# Patient Record
Sex: Male | Born: 1940
Health system: Southern US, Community
[De-identification: ages and names within clinical notes are randomized; demographics above are authoritative.]

## PROBLEM LIST (undated history)

## (undated) DIAGNOSIS — F329 Major depressive disorder, single episode, unspecified: Secondary | ICD-10-CM

## (undated) DIAGNOSIS — S2239XA Fracture of one rib, unspecified side, initial encounter for closed fracture: Secondary | ICD-10-CM

## (undated) DIAGNOSIS — I10 Essential (primary) hypertension: Secondary | ICD-10-CM

## (undated) DIAGNOSIS — F3289 Other specified depressive episodes: Secondary | ICD-10-CM

## (undated) DIAGNOSIS — Z950 Presence of cardiac pacemaker: Secondary | ICD-10-CM

## (undated) DIAGNOSIS — G4730 Sleep apnea, unspecified: Secondary | ICD-10-CM

## (undated) DIAGNOSIS — I495 Sick sinus syndrome: Secondary | ICD-10-CM

## (undated) DIAGNOSIS — M1990 Unspecified osteoarthritis, unspecified site: Secondary | ICD-10-CM

## (undated) DIAGNOSIS — E119 Type 2 diabetes mellitus without complications: Secondary | ICD-10-CM

## (undated) HISTORY — DX: Sleep apnea, unspecified: G47.30

## (undated) HISTORY — DX: Other specified depressive episodes: F32.89

## (undated) HISTORY — DX: Essential (primary) hypertension: I10

## (undated) HISTORY — PX: PROSTATE SURGERY: SHX751

## (undated) HISTORY — DX: Type 2 diabetes mellitus without complications: E11.9

## (undated) HISTORY — DX: Sick sinus syndrome: I49.5

## (undated) HISTORY — DX: Unspecified osteoarthritis, unspecified site: M19.90

## (undated) HISTORY — DX: Presence of cardiac pacemaker: Z95.0

## (undated) HISTORY — PX: INSERT / REPLACE / REMOVE PACEMAKER: SUR710

## (undated) HISTORY — DX: Major depressive disorder, single episode, unspecified: F32.9

## (undated) HISTORY — PX: HERNIA REPAIR: SHX51

## (undated) HISTORY — PX: APPENDECTOMY: SHX54

---
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
## 2003-09-02 ENCOUNTER — Inpatient Hospital Stay (HOSPITAL_COMMUNITY): Admission: EM | Admit: 2003-09-02 | Discharge: 2003-09-03 | Payer: Self-pay | Admitting: Psychiatry

## 2006-02-16 ENCOUNTER — Ambulatory Visit: Payer: Self-pay | Admitting: Gastroenterology

## 2006-05-10 ENCOUNTER — Ambulatory Visit: Admission: RE | Admit: 2006-05-10 | Discharge: 2006-08-08 | Payer: Self-pay | Admitting: Radiation Oncology

## 2006-05-17 ENCOUNTER — Ambulatory Visit: Payer: Self-pay | Admitting: Internal Medicine

## 2006-06-23 ENCOUNTER — Ambulatory Visit (HOSPITAL_BASED_OUTPATIENT_CLINIC_OR_DEPARTMENT_OTHER): Admission: RE | Admit: 2006-06-23 | Discharge: 2006-06-23 | Payer: Self-pay | Admitting: Urology

## 2007-07-21 ENCOUNTER — Ambulatory Visit (HOSPITAL_COMMUNITY): Admission: RE | Admit: 2007-07-21 | Discharge: 2007-07-21 | Payer: Self-pay | Admitting: *Deleted

## 2007-07-21 ENCOUNTER — Encounter (INDEPENDENT_AMBULATORY_CARE_PROVIDER_SITE_OTHER): Payer: Self-pay | Admitting: Urology

## 2008-02-22 ENCOUNTER — Ambulatory Visit: Payer: Self-pay | Admitting: Internal Medicine

## 2008-02-23 ENCOUNTER — Ambulatory Visit (HOSPITAL_COMMUNITY): Admission: RE | Admit: 2008-02-23 | Discharge: 2008-02-24 | Payer: Self-pay | Admitting: Internal Medicine

## 2008-02-23 ENCOUNTER — Ambulatory Visit: Payer: Self-pay | Admitting: Internal Medicine

## 2008-02-23 HISTORY — PX: OTHER SURGICAL HISTORY: SHX169

## 2008-03-11 ENCOUNTER — Ambulatory Visit: Payer: Self-pay

## 2008-05-27 ENCOUNTER — Ambulatory Visit: Payer: Self-pay | Admitting: Internal Medicine

## 2009-01-17 ENCOUNTER — Encounter (INDEPENDENT_AMBULATORY_CARE_PROVIDER_SITE_OTHER): Payer: Self-pay

## 2009-03-11 DIAGNOSIS — I495 Sick sinus syndrome: Secondary | ICD-10-CM | POA: Insufficient documentation

## 2009-03-11 DIAGNOSIS — M129 Arthropathy, unspecified: Secondary | ICD-10-CM | POA: Insufficient documentation

## 2009-03-11 DIAGNOSIS — F329 Major depressive disorder, single episode, unspecified: Secondary | ICD-10-CM | POA: Insufficient documentation

## 2009-03-11 DIAGNOSIS — F3289 Other specified depressive episodes: Secondary | ICD-10-CM | POA: Insufficient documentation

## 2009-03-11 DIAGNOSIS — Z950 Presence of cardiac pacemaker: Secondary | ICD-10-CM | POA: Insufficient documentation

## 2009-03-11 DIAGNOSIS — E119 Type 2 diabetes mellitus without complications: Secondary | ICD-10-CM | POA: Insufficient documentation

## 2009-03-11 DIAGNOSIS — G4730 Sleep apnea, unspecified: Secondary | ICD-10-CM | POA: Insufficient documentation

## 2009-03-11 DIAGNOSIS — I10 Essential (primary) hypertension: Secondary | ICD-10-CM | POA: Insufficient documentation

## 2009-08-30 ENCOUNTER — Encounter (INDEPENDENT_AMBULATORY_CARE_PROVIDER_SITE_OTHER): Payer: Self-pay | Admitting: *Deleted

## 2009-09-09 ENCOUNTER — Telehealth: Payer: Self-pay | Admitting: Internal Medicine

## 2010-07-31 ENCOUNTER — Encounter (INDEPENDENT_AMBULATORY_CARE_PROVIDER_SITE_OTHER): Payer: Self-pay | Admitting: *Deleted

## 2010-10-25 ENCOUNTER — Encounter: Payer: Self-pay | Admitting: Neurology

## 2010-11-01 LAB — CONVERTED CEMR LAB
CO2: 28 meq/L (ref 19–32)
Calcium: 9.7 mg/dL (ref 8.4–10.5)
Eosinophils Absolute: 1.1 10*3/uL — ABNORMAL HIGH (ref 0.0–0.7)
Eosinophils Relative: 15.7 % — ABNORMAL HIGH (ref 0.0–5.0)
INR: 2 — ABNORMAL HIGH (ref 0.8–1.0)
Lymphocytes Relative: 24.6 % (ref 12.0–46.0)
MCV: 86.1 fL (ref 78.0–100.0)
Monocytes Absolute: 0.5 10*3/uL (ref 0.1–1.0)
Monocytes Relative: 6.6 % (ref 3.0–12.0)
Neutrophils Relative %: 52.5 % (ref 43.0–77.0)
Platelets: 209 10*3/uL (ref 150–400)
Potassium: 4.2 meq/L (ref 3.5–5.1)
Prothrombin Time: 22.1 s — ABNORMAL HIGH (ref 10.9–13.3)
RBC: 4.87 M/uL (ref 4.22–5.81)
WBC: 7 10*3/uL (ref 4.5–10.5)
aPTT: 69.5 s — ABNORMAL HIGH (ref 21.7–29.8)

## 2010-11-03 NOTE — Miscellaneous (Signed)
Summary: dx correction   Clinical Lists Changes  Problems: Changed problem from PACEMAKER (ICD-V45..01) to PACEMAKER, PERMANENT (ICD-V45.01) changed the incorrect dx code to correct dx code Donna Keene  July 31, 2010 12:23 PM 

## 2011-02-16 NOTE — Letter (Signed)
Feb 22, 2008    Andrew Peng, MD  [address]  [place], [place] [address]   RE:  Andrew Wu, Andrew Wu  MRN:  [id number]  /  DOB:  1940-11-23   Dear Gerda Diss:   It was a pleasure seeing Andrew Wu at your request.  As you know, he  has impressive bradycardia.  He was found to have resting rates in the  40s.  Today his resting rate is in the 30s.   He has significant fatigue.  He does not have significant exercise  intolerance and this is concordant with the results from your treadmill  where he was able to accomplish a heart rate of 80% of his predicted  maximum.   He has no syncope.  He describes no chest pain and has no antecedent  history of coronary disease.  I should note that his Myoview was normal.   PAST MEDICAL HISTORY:  Notable for  1. Hypertension.  2. Diabetes.  3. Depression.  4. Sleep apnea on CPAP.  5. Arthritis.   PAST SURGICAL HISTORY:  Notable for  1. Prostate cancer for which he had seed implants last year.  2. Bilateral inguinal herniorrhaphies.  3. Appendectomy.   SOCIAL HISTORY:  He is disabled from [facility].  He has one child and  two grandchildren.  He does not drink or use recreational drugs.  He  uses cigarettes occasionally.   REVIEW OF SYSTEMS:  As noted above.   CURRENT MEDICATIONS:  1. Wellbutrin 2 tablets a day.  2. Nexium 40.  3. Metformin 500.  4. Crestor 20.  5. Lisinopril 5.  6. Felodipine 5.  7. Aspirin 81.  8. Lantus.   ALLERGIES:  He has no known drug allergies.   PHYSICAL EXAMINATION:  GENERAL APPEARANCE:  He is an older [place]  American male appearing his stated age of 25.  VITAL SIGNS:  His blood pressure is 140/70.  His weight was 153.  His  pulse was 36.  HEENT:  Exam demonstrated no icterus or xanthoma.  NECK:  His neck veins were flat.  His carotids were brisk and full  bilaterally without bruits.  BACK:  Without kyphosis or scoliosis.  LUNGS:  Clear.  HEART:  Sounds were regular with a loud S4.  ABDOMEN:  Soft with active bowel sounds without midline pulsation or  hepatomegaly.  Femoral pulses were 2+.  EXTREMITIES:  Distal pulses were intact.  No clubbing, cyanosis or  edema.  NEUROLOGIC:  Exam was grossly normal apart from a flat affect.  SKIN:  Warm and dry.   Electrocardiogram dated today demonstrated sinus rhythm at 36 with  intervals of 0.15/0.10/0.45 with a QTC of 0.35, the axis was mildly  leftward at -20.   IMPRESSION:  1. Significant sinus bradycardia with heart rates of under 40 not      infrequently.  2. Adequate heart rate excursion.  3. Fatigue and lassitude question related to the above (see below).  4. History of depression.  5. History of prostate cancer status post seed therapy.  6. Normal left ventricular function nonischemic Myoview.   Andrew Wu, Andrew Wu, has significant resting bradycardia.  I am not  altogether sanguine that a lot of his fatigue and lassitude will be  ameliorated by pacemaker therapy, but I think it is reasonable to  proceed with pacemaker given the degree of his daytime bradycardia which  might well be considerably worse at night.   I have reviewed with him and his wife the potential  benefits as well as  potential risks of the procedure including, but not limited to, death,  perforation, infection, lead dislodgement and device malfunction.  They  understand these risks and would like to proceed.   Dwayne, thanks very much for asking [place] to see him.  I look forward to  talking to you after the procedure is completed.    Sincerely,      Duke Salvia, MD, [facility]  Electronically Signed    SCK/MedQ  DD: 02/22/2008  DT: 02/22/2008  Job #: [id number]

## 2011-02-16 NOTE — Op Note (Signed)
NAME:  Andrew Wu, Andrew Wu                 ACCOUNT NO.:  [id number]   MEDICAL RECORD NO.:  [id number]          PATIENT TYPE:  AMB   LOCATION:  DAY                          FACILITY[facility]  [facility]   PHYSICIAN:  Alfonse Ras, MD   DATE OF BIRTH:  1941-05-29   DATE OF PROCEDURE:  07/21/2007  DATE OF DISCHARGE:                               OPERATIVE REPORT   PREOPERATIVE DIAGNOSIS:  Right inguinal hernia and atrophic painful  right testicle.   PROCEDURE:  Right inguinal hernia repair with mesh and right  orchiectomy.   SURGEON:  Alfonse Ras, M.D.   ASSISTANT:  Courtney Paris, M.D.   ANESTHESIA:  General laryngeal mask.   DESCRIPTION:  The patient was taken to the operating room, placed in the  supine position.  After adequate general anesthesia was induced using  laryngeal mask, an oblique incision was made over the inguinal canal.  I  dissected down to the external oblique fascia.  This was opened along  its fibers down to the external ring.  Spermatic cord was surrounded at  the external ring with Penrose drain.  It was completely mobilized.  There was no evidence of indirect hernia sac, only a direct hernia  defect.  Dr. Aldean Ast then performed a right orchiectomy without  difficulty and that will be dictated in a separate note.   I then identified the defect in the floor of Hesselbach's triangle, and  after mobilization of the inguinal ligament and the transversalis  fascia, I  closed this primarily with interrupted 0 Surgilon sutures in  a tension-free fashion.  This was taken out past the previous internal  ring.  A piece of 3 x 6 Atrium mesh was then placed over the repair and  sutured along the pubic tubercle to the inguinal ligament brought out  far lateral to the internal ring and to the transversalis fascia.  It  laid flatly and was in good position.  Adequate hemostasis was assured.  All tissues were injected with 0.5 Marcaine.  External oblique fascia  was closed  with a running 3-0 Vicryl suture.  Skin incision was closed  with staples.  Sterile dressing was applied.  The patient tolerated the  procedure well and went to PACU in good condition.      Alfonse Ras, MD  Electronically Signed     KRE/MEDQ  D:  07/21/2007  T:  07/21/2007  Job:  [id number]

## 2011-02-16 NOTE — Op Note (Signed)
NAME:  Andrew Wu, Andrew Wu NO.:  [id number]   MEDICAL RECORD NO.:  [id number]          PATIENT TYPE:  OIB   LOCATION:  2899                         FACILITY:  [facility]   PHYSICIAN:  Duke Salvia, MD, FACCDATE OF BIRTH:  Sep 15, 1941   DATE OF PROCEDURE:  02/23/2008  DATE OF DISCHARGE:                               OPERATIVE REPORT   PREOPERATIVE DIAGNOSIS:  Sinus node dysfunction - symptomatic.   POSTOPERATIVE DIAGNOSIS:  Sinus node dysfunction - symptomatic.   PROCEDURE:  Dual-chamber pacemaker implantation.   Following obtaining informed consent, the patient was brought to  electrophysiology laboratory and placed on the fluoroscopic table in  supine position.  After routine prep drape of the left upper chest,  lidocaine was infiltrated in the prepectoral subclavicular region.  Incision was made and carried down to layer of the prepectoral fascia  using electrocautery and sharp dissection.  A pocket was formed  similarly.  Hemostasis was obtained.   Thereafter, attention was turned to gain access to extrathoracic left  subclavian vein, which was accomplished without difficulty without the  aspiration or puncture of the artery.  Two separate venipunctures were  accomplished.  A guidewire was replaced and retained and sequentially 7-  French sheaths were placed, which were passed Medtronic 5076, 58-cm  active fixation ventricular lead, serial number #[id number] and a  Medtronic 5076, 52-cm active fixation atrial lead, serial #[id number].  The ventricular lead was marked with a tie.   Under fluoroscopic guidance, ventricular lead was manipulated in the  right ventricular septum where bipolar R-wave was 9 with a pace  impedance of 738, a threshold of 0.5 volts at 0.5 msec.  Current  threshold is 0.8 mA.  There is no diaphragmatic pacing at 10 volts, and  the current of injury was present.   The atrial lead was manipulated at the right atrial appendage where the  bipolar P-wave was 2.7 with a pace impedance of 678 ohms with a  threshold of 0.6 volts at 0.5 msec.  Current threshold 0.6 mA.  The  current of injury was brisk, and there is no diaphragmatic pacing at 10  volts.  These leads were secured to the prepectoral fascia and then  attached to a Medtronic Adapta ADDRL1 pulse generator, serial number  #[id number].  Ventricular pacing and atrial pacing with intrinsic  conduction were identified.  The pocket was copiously irrigated with  antibiotic-containing saline solution.  Hemostasis was assured, and the  leads and pulse generator were placed in the pocket secured to  prepectoral fascia.  The wound was closed in three layers in a normal  fashion.  The wound was washed,  dried, and a benzoin Steri-Strip dressing was applied.  Needle counts,  sponge counts, and instrument counts were correct at the end of  procedure according to the staff.  The patient tolerated the procedure  without apparent complication.      Duke Salvia, MD, [facility]  Electronically Signed     SCK/MEDQ  D:  02/23/2008  T:  02/24/2008  Job:  [id number]   cc:   Gerda Diss MD [facility]  [facility]  [facility]

## 2011-02-16 NOTE — Discharge Summary (Signed)
NAME:  Andrew Wu, Andrew Wu NO.:  [id number]   MEDICAL RECORD NO.:  [id number]          PATIENT TYPE:  OIB   LOCATION:  2008                         FACILITY:  [facility]   PHYSICIAN:  Doylene Canning. Ladona Ridgel, MD    DATE OF BIRTH:  1940-11-04   DATE OF ADMISSION:  02/23/2008  DATE OF DISCHARGE:  02/24/2008                         DISCHARGE SUMMARY - REFERRING   PRIMARY CARE PHYSICIAN:  Dr. [name].   DISCHARGE DIAGNOSES:  1. Sinus bradycardia status post Medtronic Adapta DDD pacer on Feb 23, 2008.  2. History as noted below.   SUMMARY OF HISTORY:  Andrew Wu is a 70 year old African-American male  who was referred by his primary care physician to Dr. Graciela Husbands on Feb 22, 2008.  He was seen in the office and was found to have resting heart  rates in the 30s and 40s.  He did complain of significant fatigue;  however, a treadmill with his primary care physician showed that he did  reach 80% predicted maximum, and his Myoview was unremarkable.  His  history is notable for hypertension, diabetes, depression, sleep apnea  on CPAP and arthritis, prostate cancer with seed implant, bilateral  inguinal herniorrhaphies, appendectomy.   Dr. Graciela Husbands was not sure if his symptoms would be improved by pacemaker  therapy but felt it was reasonable to proceed given the degree of his  daytime bradycardia which might considerably be worse at night, thus his  admission.   LABORATORY DATA:  Chest x-ray on the 23rd postprocedure did not show any  acute findings post pacer.  On the 21st, preadmission labs showed an H&H  of 13.8, 41.9, normal indices, platelets 209, WBCs 7.0, PTT 22.1, INR  2.0, sodium 139, potassium 4.2, BUN 21, creatinine 0.9, glucose 124.   The patient was admitted to [facility], and Dr. Graciela Husbands inserted  a Medtronic Adapta pacer via the left subclavian vein without  difficulty.  Dr. Graciela Husbands on review, on the 23rd, felt that the patient  could be discharged home.   DISPOSITION:  The patient is discharged home.  He is asked to continue  his home medications, that they have not changed.  It is noted at this  time there is not a complete list within the chart.  From what I can  tell his medications include:  1. Wellbutrin 200 mg b.i.d.  2. Nexium 40 mg daily.  3. Metformin 500 mg daily.  4. Crestor 420 mg daily.  5. Lisinopril daily.  6. Aspirin 81 mg daily.  7. Lantus 23 units daily.  8. He was given permission to take Tylenol for pain.   He is asked to bring all medications to all appointments for  clarification.   WOUND CARE AND ACTIVITIES:  Per supplemental sheet post pacer.   He was asked to maintain a low-sodium heart-healthy, ADA diet.  Specifically, he was advised no driving Z61 days and no lifting x1 week.   DISCHARGE TIME:  35 minutes.      Joellyn Rued, PA-C      Doylene Canning. Ladona Ridgel, MD  Electronically Signed  EW/MEDQ  D:  02/24/2008  T:  02/24/2008  Job:  [id number]   cc:   Dr. [name],  [place], [place]  Duke Salvia, MD, [facility]

## 2011-02-16 NOTE — Op Note (Signed)
NAME:  Andrew Wu, Andrew Wu                 ACCOUNT NO.:  [id number]   MEDICAL RECORD NO.:  [id number]          PATIENT TYPE:  AMB   LOCATION:  DAY                          FACILITY:  [facility]   PHYSICIAN:  Courtney Paris, M.D.DATE OF BIRTH:  Dec 09, 1940   DATE OF PROCEDURE:  DATE OF DISCHARGE:  07/21/2007                               OPERATIVE REPORT   PREOPERATIVE DIAGNOSES:  1. Right inhomogeneous testicular mass.  2. Right inguinal hernia.   POSTOPERATIVE DIAGNOSES:  1. Right inhomogeneous testicular mass.  2. Right inguinal hernia.   OPERATION:  Right inguinal orchiectomy.   ANESTHESIA:  General.   SURGEON:  Courtney Paris, M.D.   ASSISTANT:  Alfonse Ras, M.D.   INDICATION:  This 70 year old patient had prostate cancer radiated and  was doing well, but turned up with an atrophic small right testis that  was painful and had on ultrasound, an inhomogeneous mass and normal  serum markers; he also had a right inguinal hernia; for this reason, he  was advised to have a right inguinal orchiectomy at the same time as his  inguinal hernia repair.   The patient was placed supine on the operative table and after the  satisfactory induction of general anesthesia, he was prepped and draped  with Betadine in the usual sterile fashion.  The side was marked and  prepped appropriately.  Dr. Colin Benton made the incision down through the  inguinal area, down to the external oblique fascia, which was opened,  and the cord was identified.  The testis was then pulled up into the  operative field.  The attachments at the gubernaculum of the inferior  testicular attachments were cut with the Bovie and the testis was then  ligated at the internal inguinal ring.  It was doubly suture-ligated  with #1 silk and the testis was then removed.  The rest of the operative  procedure will be dictated separately by Dr. Colin Benton and she went ahead  and completed the inguinal hernia repair.      Courtney Paris, M.D.  Electronically Signed     HMK/MEDQ  D:  07/21/2007  T:  07/23/2007  Job:  [id number]

## 2011-02-16 NOTE — Letter (Signed)
May 27, 2008    Dr. [name]  [address]  [address]  [place], [place]  [address]   RE:  Andrew, Wu  MRN:  [id number]  /  DOB:  [birth year]   Dear Gerda Diss,   It was a good talking to you today.  Andrew Wu pacemaker is working  fine implanted for sinus node dysfunction.  He is 93% atrial paced and  has some significant improvement in exercise tolerance.   His medications include Wellbutrin, Nexium, metformin, Crestor,  lisinopril, aspirin, and Lantus.  (See below).   On examination today, he was in no acute distress.  His blood pressure  was 130/80.  His pulse was 64.  His weight was 148.  His lungs were  clear.  His heart sounds were regular.  His pacemaker pocket was well  healed.  His extremities were without edema.   Interrogation of his Medtronic Adapta pulse generator demonstrates an R-  wave of 11 with impedance of 607, and threshold of 0.5 at 0.4.  The R-  wave of 5.6, impedance of 522, and threshold of 0.5 at 0.4.  Battery  voltage is 2.8.   IMPRESSION:  1. Sinus node dysfunction.  2. Status post pacemaker implantation for the above.  3. Truncated heart rate excursion.  4. Difficulty in affording medications.   Dwayne, I reprogrammed Andrew Wu's pacemaker to decrease his response  to his ADLs to hopefully have his heart rate not quite so rapid on  initial activity.  We have increased his max sensory rate from 130-150,  given his relatively young age and physiological age.   Further, because of the cost of the medications, I wrote him a  prescription for a generic Zocor and ranitidine to try to get on the [facility] plan and to see if these are sufficient for his symptoms and his  cholesterol.  He is to follow up with you as we discussed.  If there is  anything I can do further, please do not hesitate to contact me.    Sincerely,      Duke Salvia, MD, [facility]  Electronically Signed    SCK/MedQ  DD: 05/27/2008  DT: 05/28/2008  Job #:  [id number]

## 2011-02-19 NOTE — Discharge Summary (Signed)
NAME:  Andrew Wu, Andrew Wu NO.:  [id number]   MEDICAL RECORD NO.:  [id number]                   PATIENT TYPE:  IPS   LOCATION:  0304                                 FACILITY:  [facility]   PHYSICIAN:  Geoffery Lyons, M.D.                   DATE OF BIRTH:  December 14, 1940   DATE OF ADMISSION:  09/02/2003  DATE OF DISCHARGE:  09/03/2003                                 DISCHARGE SUMMARY   CHIEF COMPLAINT AND PRESENT ILLNESS:  This was the first admission to [facility] for this 70 year old married African-American male  with history of depression for years, lost a sister over the weekend, wanted  to give up as well what's the use.  Decreased motivation, decreased  energy, cannot make decisions, decreased sleep, felt tired, does not trust  himself to make decisions, passive suicidal ideation.  No plan.   PAST PSYCHIATRIC HISTORY:  First time at [facility].  No other  treatment.  Mariane Masters.  No history of suicide attempts.   ALCOHOL/DRUG HISTORY:  Smokes.  Denies any alcohol or any other substance  abuse.   PAST MEDICAL HISTORY:  Coronary artery disease, status post stent placement,  type 2 diabetes mellitus, gastroesophageal reflux, sleep apnea.   MEDICATIONS:  Celexa 20 mg every day, Plavix 75 mg daily, Lipitor 80 mg at  night, Altace 5 mg in the morning, Avandia 8 mg daily, Glucophage 500 mg, 2  at night.   PHYSICAL EXAMINATION:  Performed and failed to show any acute findings.   MENTAL STATUS EXAM:  Alert, cooperative male with some psychomotor  retardation, anhedonic.  Very soft spoken.  Mood depressed, tired.  Affect  depressed.  Thought processes endorses positive auditory hallucinations.  Does not appear to be responding to internal stimuli.  Decreased  concentration.  Cognition otherwise was within normal limits.   ADMISSION DIAGNOSES:   AXIS I:  Major depression; rule out with psychotic features.   AXIS II:  No diagnosis.   AXIS III:  1. Type 2 diabetes mellitus.  2. Gastroesophageal reflux.  3. Coronary artery disease.   AXIS IV:  Moderate.   AXIS V:  Global Assessment of Functioning upon admission 25; highest Global  Assessment of Functioning in the last year 65.   LABORATORY DATA:  Thyroid profile within normal limits.   HOSPITAL COURSE:  He was admitted and started intensive individual and group  psychotherapy.  He was maintained on his medications.  He was given Ambien  for sleep.  He was given 70/30 Humulin insulin 50 units in the morning and  10 mg at night, Altace 5 mg in the morning, Glucophage 500 mg, 2 at night,  Glucovance 5\500 mg in the morning, Avandia 8 mg in the morning, Plavix 75  mg in the morning, Xanax 0.5 mg every six hours as needed for anxiety,  Celexa 20 mg per day, Lipitor  80 mg at night, Nexium 30 mg before dinner.  He basically stayed the one day.  The wife was present through most of his  stay.  She was concerned that leaving the place was not going to be helping  him.  He felt like he was going to get worse if he was to stay.  The patient  agreed with his wife.  They were going to see his counselor, Mariane Masters.  Would have preferred to go to [facility].  Wife wanted to  take him home.  He was denying any suicidal ideation.  She was going to keep  an eye on him, so we went ahead and discharged to outpatient follow-up.   DISCHARGE DIAGNOSES:   AXIS I:  Major depression with psychotic features.   AXIS II:  No diagnosis.   AXIS III:  1. Coronary artery disease.  2. Gastroesophageal reflux.  3. Type 2 diabetes mellitus.   AXIS IV:  Moderate.   AXIS V:  Global Assessment of Functioning upon discharge 45.   DISCHARGE MEDICATIONS:  Continue medications.   FOLLOW UP:  Mariane Masters and Dr. Raquel James.                                               Geoffery Lyons, M.D.    IL/MEDQ  D:  09/24/2003  T:  09/25/2003  Job:  [id number]

## 2011-02-19 NOTE — Op Note (Signed)
NAME:  Andrew Wu, Andrew Wu                 ACCOUNT NO.:  [id number]   MEDICAL RECORD NO.:  [id number]          PATIENT TYPE:  AMB   LOCATION:  [facility]                         FACILITY:  [facility]   PHYSICIAN:  Courtney Paris, M.D.DATE OF BIRTH:  [birth year]   DATE OF PROCEDURE:  06/23/2006  DATE OF DISCHARGE:  06/23/2006                                 OPERATIVE REPORT   PREOPERATIVE DIAGNOSIS:  T2b Gleason 3 + 3 adenocarcinoma of prostate.   POSTOPERATIVE DIAGNOSIS:  T2b Gleason 3 + 3 adenocarcinoma of prostate.   OPERATION:  Prostate brachytherapy and cystoscopy and removal of foreign  body.   ANESTHESIA:  General.   SURGEONS:  1. Courtney Paris, M.D.  2. Maryln Gottron, M.D.   BRIEF HISTORY:  This 70 year old black male is admitted with clinical stage  2b Gleason 3 + 3 adenocarcinoma of the prostate.  He comes in for seed  implant at this time.  He had a PSA of 4.2, June 2007, also 4.6, August  2006, 3.8, July 2006.  He is diabetic.  He has had heart stents and has had  depression, and has been on disability since May 2007.  He enters now for  prostate brachytherapy as primary treatment for his prostate cancer.   DESCRIPTION OF PROCEDURE:  The patient was placed on the operating table in  the dorsal lithotomy position after satisfactory induction of general  anesthesia.  The Foley catheter was inserted as well as the rectal tube and  the 7.5-MHz rectal ultrasound probe.  Prostate was positioned as it was on  the preliminary ultrasounds.  The urethra and rectum were outlined and the  treatment was then planned.  A total of 23 needles were then used to insert  64 seeds of I-125, taking care to preserve the urethra and spare the rectum.  This was done systematically with a Nucletron delivery device, effecting a  good implant as seen on a post-implant x-ray.  The ultrasound was used to  guide the needle.  The probes and ultrasound were then removed, the patient  was reprepped  and draped and a flexible cystoscope was then passed into the  bladder.  He had a mildly enlarged prostate, no anterior stricture was seen.  Posterior urethra was not obstructing.  The bladder was entered.  There were  a few small blood clots in the bladder and a small seed was noted.  I tried  to get this with the flexible scope and a grasper, but was unable to do so.  I had to then use a #21 panendoscope with a grasping forceps of grab the  seed and remove this intact.  When this was done, a #18 Foley catheter was  inserted and left to straight drainage.  The urine was clear.  The patient  then taken to the recovery room in good condition and will be later  discharged as an outpatient and we will remove his catheter in 4 days' time.  He will be covered with antibiotics and detailed postoperative instructions  will be given.      [place].  Aldean Ast, M.D.  Electronically Signed    HMK/MEDQ  D:  06/23/2006  T:  06/25/2006  Job:  [id number]

## 2011-03-25 ENCOUNTER — Encounter: Payer: Self-pay | Admitting: Cardiovascular Disease

## 2011-07-14 LAB — DIFFERENTIAL
Basophils Relative: 1
Eosinophils Absolute: 1.4 — ABNORMAL HIGH
Neutrophils Relative %: 57

## 2011-07-14 LAB — URINALYSIS, ROUTINE W REFLEX MICROSCOPIC
Ketones, ur: NEGATIVE
Leukocytes, UA: NEGATIVE
Protein, ur: NEGATIVE
Urobilinogen, UA: 0.2

## 2011-07-14 LAB — CBC
Hemoglobin: 13.5
Platelets: 208

## 2011-07-14 LAB — URINE MICROSCOPIC-ADD ON

## 2011-08-13 ENCOUNTER — Emergency Department: Payer: Self-pay | Admitting: *Deleted

## 2011-11-05 ENCOUNTER — Other Ambulatory Visit (HOSPITAL_COMMUNITY): Payer: Self-pay | Admitting: Urology

## 2011-11-11 ENCOUNTER — Ambulatory Visit (HOSPITAL_COMMUNITY)
Admission: RE | Admit: 2011-11-11 | Discharge: 2011-11-11 | Disposition: A | Discharge status: Home / Self Care | Payer: Medicare Other | Source: Ambulatory Visit | Attending: Urology | Admitting: Urology

## 2011-11-11 DIAGNOSIS — N433 Hydrocele, unspecified: Secondary | ICD-10-CM | POA: Insufficient documentation

## 2011-12-15 ENCOUNTER — Ambulatory Visit: Payer: Self-pay | Admitting: Otolaryngology

## 2011-12-29 DIAGNOSIS — R0982 Postnasal drip: Secondary | ICD-10-CM | POA: Insufficient documentation

## 2011-12-29 DIAGNOSIS — E119 Type 2 diabetes mellitus without complications: Secondary | ICD-10-CM | POA: Insufficient documentation

## 2011-12-29 DIAGNOSIS — R059 Cough, unspecified: Secondary | ICD-10-CM | POA: Insufficient documentation

## 2011-12-29 DIAGNOSIS — J342 Deviated nasal septum: Secondary | ICD-10-CM | POA: Insufficient documentation

## 2011-12-29 DIAGNOSIS — R1310 Dysphagia, unspecified: Secondary | ICD-10-CM | POA: Insufficient documentation

## 2012-02-16 ENCOUNTER — Emergency Department: Payer: Self-pay | Admitting: Emergency Medicine

## 2012-11-29 ENCOUNTER — Ambulatory Visit: Payer: Self-pay | Admitting: Gastroenterology

## 2013-11-14 DIAGNOSIS — J329 Chronic sinusitis, unspecified: Secondary | ICD-10-CM | POA: Insufficient documentation

## 2013-12-18 ENCOUNTER — Emergency Department: Payer: Self-pay | Admitting: Emergency Medicine

## 2014-10-07 ENCOUNTER — Emergency Department (HOSPITAL_COMMUNITY): Payer: Commercial Managed Care - HMO

## 2014-10-07 ENCOUNTER — Emergency Department (HOSPITAL_COMMUNITY)
Admission: EM | Admit: 2014-10-07 | Discharge: 2014-10-07 | Disposition: A | Discharge status: Home / Self Care | Payer: Commercial Managed Care - HMO | Attending: Emergency Medicine | Admitting: Emergency Medicine

## 2014-10-07 ENCOUNTER — Encounter (HOSPITAL_COMMUNITY): Payer: Self-pay | Admitting: Cardiology

## 2014-10-07 DIAGNOSIS — Y9289 Other specified places as the place of occurrence of the external cause: Secondary | ICD-10-CM | POA: Insufficient documentation

## 2014-10-07 DIAGNOSIS — J181 Lobar pneumonia, unspecified organism: Secondary | ICD-10-CM | POA: Insufficient documentation

## 2014-10-07 DIAGNOSIS — Z720 Tobacco use: Secondary | ICD-10-CM | POA: Insufficient documentation

## 2014-10-07 DIAGNOSIS — M6281 Muscle weakness (generalized): Secondary | ICD-10-CM | POA: Insufficient documentation

## 2014-10-07 DIAGNOSIS — S299XXA Unspecified injury of thorax, initial encounter: Secondary | ICD-10-CM | POA: Diagnosis not present

## 2014-10-07 DIAGNOSIS — Y998 Other external cause status: Secondary | ICD-10-CM | POA: Diagnosis not present

## 2014-10-07 DIAGNOSIS — S79911A Unspecified injury of right hip, initial encounter: Secondary | ICD-10-CM | POA: Diagnosis not present

## 2014-10-07 DIAGNOSIS — W1830XA Fall on same level, unspecified, initial encounter: Secondary | ICD-10-CM | POA: Insufficient documentation

## 2014-10-07 DIAGNOSIS — I10 Essential (primary) hypertension: Secondary | ICD-10-CM | POA: Insufficient documentation

## 2014-10-07 DIAGNOSIS — Y9389 Activity, other specified: Secondary | ICD-10-CM | POA: Insufficient documentation

## 2014-10-07 DIAGNOSIS — Z950 Presence of cardiac pacemaker: Secondary | ICD-10-CM | POA: Diagnosis not present

## 2014-10-07 DIAGNOSIS — E119 Type 2 diabetes mellitus without complications: Secondary | ICD-10-CM | POA: Diagnosis not present

## 2014-10-07 DIAGNOSIS — Z8669 Personal history of other diseases of the nervous system and sense organs: Secondary | ICD-10-CM | POA: Diagnosis not present

## 2014-10-07 DIAGNOSIS — S0990XA Unspecified injury of head, initial encounter: Secondary | ICD-10-CM | POA: Diagnosis not present

## 2014-10-07 DIAGNOSIS — R29898 Other symptoms and signs involving the musculoskeletal system: Secondary | ICD-10-CM

## 2014-10-07 DIAGNOSIS — S8991XA Unspecified injury of right lower leg, initial encounter: Secondary | ICD-10-CM | POA: Diagnosis not present

## 2014-10-07 DIAGNOSIS — J189 Pneumonia, unspecified organism: Secondary | ICD-10-CM

## 2014-10-07 DIAGNOSIS — S3992XA Unspecified injury of lower back, initial encounter: Secondary | ICD-10-CM | POA: Diagnosis not present

## 2014-10-07 DIAGNOSIS — Z8659 Personal history of other mental and behavioral disorders: Secondary | ICD-10-CM | POA: Insufficient documentation

## 2014-10-07 DIAGNOSIS — M25551 Pain in right hip: Secondary | ICD-10-CM | POA: Diagnosis not present

## 2014-10-07 DIAGNOSIS — Z8739 Personal history of other diseases of the musculoskeletal system and connective tissue: Secondary | ICD-10-CM | POA: Diagnosis not present

## 2014-10-07 DIAGNOSIS — R05 Cough: Secondary | ICD-10-CM | POA: Diagnosis not present

## 2014-10-07 LAB — CBG MONITORING, ED
GLUCOSE-CAPILLARY: 348 mg/dL — AB (ref 70–99)
GLUCOSE-CAPILLARY: 261 mg/dL — AB (ref 70–99)
Glucose-Capillary: 261 mg/dL — ABNORMAL HIGH (ref 70–99)

## 2014-10-07 LAB — COMPREHENSIVE METABOLIC PANEL
ALK PHOS: 58 U/L (ref 39–117)
ALT: 27 U/L (ref 0–53)
ANION GAP: 11 (ref 5–15)
AST: 68 U/L — ABNORMAL HIGH (ref 0–37)
Albumin: 3.2 g/dL — ABNORMAL LOW (ref 3.5–5.2)
BILIRUBIN TOTAL: 1.6 mg/dL — AB (ref 0.3–1.2)
BUN: 23 mg/dL (ref 6–23)
CHLORIDE: 98 meq/L (ref 96–112)
CO2: 20 mmol/L (ref 19–32)
CREATININE: 1.25 mg/dL (ref 0.50–1.35)
Calcium: 9.1 mg/dL (ref 8.4–10.5)
GFR calc non Af Amer: 56 mL/min — ABNORMAL LOW (ref >90)
GFR, EST AFRICAN AMERICAN: 65 mL/min — AB (ref >90)
GLUCOSE: 373 mg/dL — AB (ref 70–99)
POTASSIUM: 4.4 mmol/L (ref 3.5–5.1)
Sodium: 129 mmol/L — ABNORMAL LOW (ref 135–145)
Total Protein: 6.3 g/dL (ref 6.0–8.3)

## 2014-10-07 LAB — URINALYSIS, ROUTINE W REFLEX MICROSCOPIC
Bilirubin Urine: NEGATIVE
Glucose, UA: >1000 mg/dL — AB
KETONES UR: 40 mg/dL — AB
LEUKOCYTES UA: NEGATIVE
Nitrite: NEGATIVE
PROTEIN: NEGATIVE mg/dL
Specific Gravity, Urine: 1.034 — ABNORMAL HIGH (ref 1.005–1.030)
UROBILINOGEN UA: 0.2 mg/dL (ref 0.0–1.0)
pH: 5 (ref 5.0–8.0)

## 2014-10-07 LAB — CBC WITH DIFFERENTIAL/PLATELET
Basophils Absolute: 0 10*3/uL (ref 0.0–0.1)
Basophils Relative: 0 % (ref 0–1)
EOS PCT: 0 % (ref 0–5)
Eosinophils Absolute: 0 10*3/uL (ref 0.0–0.7)
HEMATOCRIT: 36.9 % — AB (ref 39.0–52.0)
HEMOGLOBIN: 12.3 g/dL — AB (ref 13.0–17.0)
LYMPHS ABS: 0.8 10*3/uL (ref 0.7–4.0)
LYMPHS PCT: 9 % — AB (ref 12–46)
MCH: 27 pg (ref 26.0–34.0)
MCHC: 33.3 g/dL (ref 30.0–36.0)
MCV: 81.1 fL (ref 78.0–100.0)
MONO ABS: 0.8 10*3/uL (ref 0.1–1.0)
Monocytes Relative: 10 % (ref 3–12)
Neutro Abs: 7.1 10*3/uL (ref 1.7–7.7)
Neutrophils Relative %: 81 % — ABNORMAL HIGH (ref 43–77)
Platelets: 168 10*3/uL (ref 150–400)
RBC: 4.55 MIL/uL (ref 4.22–5.81)
RDW: 14.3 % (ref 11.5–15.5)
WBC: 8.7 10*3/uL (ref 4.0–10.5)

## 2014-10-07 LAB — URINE MICROSCOPIC-ADD ON

## 2014-10-07 LAB — I-STAT TROPONIN, ED: Troponin i, poc: 0.07 ng/mL (ref 0.00–0.08)

## 2014-10-07 MED ORDER — ACETAMINOPHEN 325 MG PO TABS: 650.0000 mg | ORAL_TABLET | Freq: Once | ORAL | Status: DC

## 2014-10-07 MED ORDER — AZITHROMYCIN 250 MG PO TABS
500.0000 mg | ORAL_TABLET | Freq: Once | ORAL | Status: AC
  Administered 2014-10-07: 500 mg via ORAL
  Filled 2014-10-07: qty 2

## 2014-10-07 MED ORDER — ACETAMINOPHEN 500 MG PO TABS
1000.0000 mg | ORAL_TABLET | Freq: Once | ORAL | Status: AC
  Administered 2014-10-07: 1000 mg via ORAL
  Filled 2014-10-07: qty 2

## 2014-10-07 MED ORDER — SODIUM CHLORIDE 0.9 % IV BOLUS (SEPSIS)
500.0000 mL | Freq: Once | INTRAVENOUS | Status: AC
  Administered 2014-10-07: 500 mL via INTRAVENOUS

## 2014-10-07 MED ORDER — AZITHROMYCIN 250 MG PO TABS: 250.0000 mg | ORAL_TABLET | Freq: Every day | ORAL | Status: DC

## 2014-10-07 NOTE — ED Notes (Signed)
Patient ready for discharge.  He and wife verbalized understanding of discharge instructions.  Patient to continue regular meds.  Treat fever at home.  Return for any increased weakness or sob or other concerns.

## 2014-10-07 NOTE — Discharge Instructions (Signed)
Zithromax as prescribed.  Follow-up with your primary Dr. to discuss your medications and whether he believes treatment with prednisone is appropriate.  Return to the emergency department if your symptoms substantially worsen or change. Pneumonia Pneumonia is an infection of the lungs.  CAUSES Pneumonia may be caused by bacteria or a virus. Usually, these infections are caused by breathing infectious particles into the lungs (respiratory tract). SIGNS AND SYMPTOMS   Cough.  Fever.  Chest pain.  Increased rate of breathing.  Wheezing.  Mucus production. DIAGNOSIS  If you have the common symptoms of pneumonia, your health care provider will typically confirm the diagnosis with a chest X-ray. The X-ray will show an abnormality in the lung (pulmonary infiltrate) if you have pneumonia. Other tests of your blood, urine, or sputum may be done to find the specific cause of your pneumonia. Your health care provider may also do tests (blood gases or pulse oximetry) to see how well your lungs are working. TREATMENT  Some forms of pneumonia may be spread to other people when you cough or sneeze. You may be asked to wear a mask before and during your exam. Pneumonia that is caused by bacteria is treated with antibiotic medicine. Pneumonia that is caused by the influenza virus may be treated with an antiviral medicine. Most other viral infections must run their course. These infections will not respond to antibiotics.  HOME CARE INSTRUCTIONS   Cough suppressants may be used if you are losing too much rest. However, coughing protects you by clearing your lungs. You should avoid using cough suppressants if you can.  Your health care provider may have prescribed medicine if he or she thinks your pneumonia is caused by bacteria or influenza. Finish your medicine even if you start to feel better.  Your health care provider may also prescribe an expectorant. This loosens the mucus to be coughed  up.  Take medicines only as directed by your health care provider.  Do not smoke. Smoking is a common cause of bronchitis and can contribute to pneumonia. If you are a smoker and continue to smoke, your cough may last several weeks after your pneumonia has cleared.  A cold steam vaporizer or humidifier in your room or home may help loosen mucus.  Coughing is often worse at night. Sleeping in a semi-upright position in a recliner or using a couple pillows under your head will help with this.  Get rest as you feel it is needed. Your body will usually let you know when you need to rest. PREVENTION A pneumococcal shot (vaccine) is available to prevent a common bacterial cause of pneumonia. This is usually suggested for:  People over 90 years old.  Patients on chemotherapy.  People with chronic lung problems, such as bronchitis or emphysema.  People with immune system problems. If you are over 65 or have a high risk condition, you may receive the pneumococcal vaccine if you have not received it before. In some countries, a routine influenza vaccine is also recommended. This vaccine can help prevent some cases of pneumonia.You may be offered the influenza vaccine as part of your care. If you smoke, it is time to quit. You may receive instructions on how to stop smoking. Your health care provider can provide medicines and counseling to help you quit. SEEK MEDICAL CARE IF: You have a fever. SEEK IMMEDIATE MEDICAL CARE IF:   Your illness becomes worse. This is especially true if you are elderly or weakened from any other disease.  You cannot control your cough with suppressants and are losing sleep.  You begin coughing up blood.  You develop pain which is getting worse or is uncontrolled with medicines.  Any of the symptoms which initially brought you in for treatment are getting worse rather than better.  You develop shortness of breath or chest pain. MAKE SURE YOU:   Understand  these instructions.  Will watch your condition.  Will get help right away if you are not doing well or get worse. Document Released: 09/20/2005 Document Revised: 02/04/2014 Document Reviewed: 12/10/2010 [facility] Patient Information 2015 [place], [place]. This information is not intended to replace advice given to you by your health care provider. Make sure you discuss any questions you have with your health care provider.

## 2014-10-07 NOTE — ED Provider Notes (Addendum)
CSN: [id number]     Arrival date & time 10/07/14  3546 History   First MD Initiated Contact with Patient 10/07/14 0848     Chief Complaint  Patient presents with  . Fall  . 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 He presents today with complaints of generalized weakness that has worsened for the past 2 weeks. He states that his right knee "gave out" on him this morning while he was attempting to ambulate and caused him to fall. He denies any specific injury. He had a similar episode 2 or 3 years ago and was treated with a steroid. This seemed to help his symptoms.  The patient's family member at bedside is also endorsing multiple other complaints, including disorientation that has been occurring intermittently for the past 2 weeks, right hip pain. She also reports that he has had a tremor for the past 5 years. This has been worked up by neurology and was thought to be a benign tremor and non-Parkinson's related.  Patient is a 74 y.o. male presenting with weakness. The history is provided by the patient.  Weakness This is a new problem. Episode onset: 2 weeks ago. The problem occurs constantly. The problem has been gradually worsening. Pertinent negatives include no chest pain, no headaches and no shortness of breath. Nothing aggravates the symptoms. Nothing relieves the symptoms. He has tried nothing for the symptoms. The treatment provided no relief.    Past Medical History  Diagnosis Date  . Cardiac pacemaker in situ   . Sinoatrial node dysfunction   . Unspecified essential hypertension   . Arthritis   . Unspecified sleep apnea   . Depressive disorder, not elsewhere classified   . Type II or unspecified type diabetes mellitus without mention of complication, not stated as  uncontrolled    Past Surgical History  Procedure Laterality Date  . Pacemaker  02/23/08    medtronic Adapta-sinus node dysfunction    History reviewed. No pertinent family history. History  Substance Use Topics  . Smoking status: Current Some Day Smoker  . Smokeless tobacco: Not on file  . Alcohol Use: No    Review of Systems  Respiratory: Negative for shortness of breath.   Cardiovascular: Negative for chest pain.  Neurological: Positive for weakness. Negative for headaches.  All other systems reviewed and are negative.     Allergies  Asa  Home Medications   Prior to Admission medications   Not on File   BP 128/55 mmHg  Pulse 71  Temp(Src) 99.2 F (37.3 C) (Oral)  Resp 16  Ht 5' 7.5" (1.715 m)  Wt 160 lb (72.576 kg)  BMI 24.68 kg/m2  SpO2 98% Physical Exam  Constitutional: He is oriented to person, place, and time. He appears well-developed and well-nourished. No distress.  HENT:  Head: Normocephalic and atraumatic.  Mouth/Throat: Oropharynx is clear and moist.  Eyes: EOM are normal. Pupils are equal, round, and reactive to light.  Neck: Normal range of motion. Neck supple.  Cardiovascular: Normal rate, regular rhythm and normal heart sounds.   No murmur heard. Pulmonary/Chest: Effort normal and breath sounds normal. No respiratory distress. He has no wheezes.  Abdominal: Soft. Bowel sounds are normal. He exhibits no distension. There is no tenderness.  Musculoskeletal: Normal range of motion. He exhibits no edema.  The right hip and remainder of the right lower extremity all appear  normal. There is no deformity or swelling. Both legs are symmetrical. There is no calf tenderness or swelling. Homans sign is absent bilaterally. DP pulses are palpable bilaterally.  He has good range of motion with his right hip and knee without effusion or limitation.  Lymphadenopathy:    He has no cervical adenopathy.  Neurological: He is alert and oriented to person, place,  and time. No cranial nerve deficit. He exhibits normal muscle tone. Coordination normal.  Skin: Skin is warm and dry. He is not diaphoretic.  Nursing note and vitals reviewed.   ED Course  Procedures (including critical care time) Labs Review Labs Reviewed  COMPREHENSIVE METABOLIC PANEL  CBC WITH DIFFERENTIAL  URINALYSIS, ROUTINE W REFLEX MICROSCOPIC  I-STAT [place], ED    Imaging Review No results found.   EKG Interpretation   Date/Time:  Monday October 07 2014 08:53:02 EST Ventricular Rate:  79 PR Interval:  147 QRS Duration: 109 QT Interval:  363 QTC Calculation: 416 R Axis:   -48 Text Interpretation:  Sinus rhythm Incomplete RBBB and LAFB Confirmed by  DELOS  MD, Dublin Grayer ([id number]) on 10/07/2014 10:49:23 AM      MDM   Final diagnoses:  Right leg weakness    Patient is a 74 year old male who presents for evaluation of weakness, tremor, and recent falls. Is also complaining of hip pain. He has multiple complaints that are seemingly unrelated. His workup does not reveal any acute abnormality. His x-rays do not show any obvious bony abnormality and CT scan of the head is unremarkable. His acquaintance at bedside states that he is had a "pinched nerve" before which was treated with prednisone. She is reluctant to allow him to take this again due to his blood sugars being elevated in the past.  I have found nothing emergent that requires hospitalization or acute intervention. I discussed the results of these tests with the patient and his acquaintance. She will make arrangements for him to follow-up with a primary doctor in a few days. They will discuss the prednisone which I had recommended.    Veryl Speak, MD 10/07/14 1120   Upon being discharged, it was noted that the patient was febrile with a temperature to 102.8. As there was no source for this, a chest x-ray and blood cultures were obtained as well. This shows a possible infiltrate in the right lower lobe. This  will be treated with Zithromax and when necessary return. He is not hypoxic, not having any respiratory difficulty, and is very anxious to be discharged. His blood sugars are also improving with IV fluids and I feel as though discharge is appropriate. He understands to return if his symptoms worsen or change.  Veryl Speak, MD 10/07/14 1447

## 2014-10-07 NOTE — ED Notes (Signed)
Pt reports that he fell yesterday, reports that he has pain in the right knee that caused him to fall. Reports that he has had some intermittent weakness on that side also.

## 2014-10-07 NOTE — ED Notes (Signed)
Patient noted to have temp at time of planned dicharge.  ERMD notified

## 2014-10-08 DIAGNOSIS — E871 Hypo-osmolality and hyponatremia: Secondary | ICD-10-CM | POA: Diagnosis not present

## 2014-10-08 DIAGNOSIS — E1165 Type 2 diabetes mellitus with hyperglycemia: Secondary | ICD-10-CM | POA: Diagnosis not present

## 2014-10-13 LAB — CULTURE, BLOOD (ROUTINE X 2)
CULTURE: NO GROWTH
Culture: NO GROWTH

## 2014-10-16 DIAGNOSIS — H4011X3 Primary open-angle glaucoma, severe stage: Secondary | ICD-10-CM | POA: Diagnosis not present

## 2014-10-21 DIAGNOSIS — E1165 Type 2 diabetes mellitus with hyperglycemia: Secondary | ICD-10-CM | POA: Diagnosis not present

## 2014-10-21 DIAGNOSIS — Z0000 Encounter for general adult medical examination without abnormal findings: Secondary | ICD-10-CM | POA: Diagnosis not present

## 2014-10-21 DIAGNOSIS — E780 Pure hypercholesterolemia: Secondary | ICD-10-CM | POA: Diagnosis not present

## 2014-10-21 DIAGNOSIS — I10 Essential (primary) hypertension: Secondary | ICD-10-CM | POA: Diagnosis not present

## 2014-10-29 DIAGNOSIS — I700 Atherosclerosis of aorta: Secondary | ICD-10-CM | POA: Diagnosis not present

## 2014-10-29 DIAGNOSIS — I2510 Atherosclerotic heart disease of native coronary artery without angina pectoris: Secondary | ICD-10-CM | POA: Diagnosis not present

## 2014-10-29 DIAGNOSIS — J189 Pneumonia, unspecified organism: Secondary | ICD-10-CM | POA: Diagnosis not present

## 2014-10-29 DIAGNOSIS — Z0000 Encounter for general adult medical examination without abnormal findings: Secondary | ICD-10-CM | POA: Diagnosis not present

## 2014-10-29 DIAGNOSIS — I10 Essential (primary) hypertension: Secondary | ICD-10-CM | POA: Diagnosis not present

## 2014-12-11 DIAGNOSIS — N433 Hydrocele, unspecified: Secondary | ICD-10-CM | POA: Diagnosis not present

## 2014-12-11 DIAGNOSIS — C61 Malignant neoplasm of prostate: Secondary | ICD-10-CM | POA: Diagnosis not present

## 2015-01-16 DIAGNOSIS — H4011X3 Primary open-angle glaucoma, severe stage: Secondary | ICD-10-CM | POA: Diagnosis not present

## 2015-01-23 DIAGNOSIS — H4011X3 Primary open-angle glaucoma, severe stage: Secondary | ICD-10-CM | POA: Diagnosis not present

## 2015-02-13 DIAGNOSIS — I495 Sick sinus syndrome: Secondary | ICD-10-CM | POA: Diagnosis not present

## 2015-02-19 DIAGNOSIS — E1165 Type 2 diabetes mellitus with hyperglycemia: Secondary | ICD-10-CM | POA: Diagnosis not present

## 2015-02-19 DIAGNOSIS — I10 Essential (primary) hypertension: Secondary | ICD-10-CM | POA: Diagnosis not present

## 2015-02-25 DIAGNOSIS — H04123 Dry eye syndrome of bilateral lacrimal glands: Secondary | ICD-10-CM | POA: Diagnosis not present

## 2015-03-16 DIAGNOSIS — S0502XA Injury of conjunctiva and corneal abrasion without foreign body, left eye, initial encounter: Secondary | ICD-10-CM | POA: Diagnosis not present

## 2015-03-17 DIAGNOSIS — H15102 Unspecified episcleritis, left eye: Secondary | ICD-10-CM | POA: Diagnosis not present

## 2015-03-24 DIAGNOSIS — H15102 Unspecified episcleritis, left eye: Secondary | ICD-10-CM | POA: Diagnosis not present

## 2015-05-27 DIAGNOSIS — H4011X3 Primary open-angle glaucoma, severe stage: Secondary | ICD-10-CM | POA: Diagnosis not present

## 2015-07-01 DIAGNOSIS — H4011X3 Primary open-angle glaucoma, severe stage: Secondary | ICD-10-CM | POA: Diagnosis not present

## 2015-08-20 DIAGNOSIS — H401133 Primary open-angle glaucoma, bilateral, severe stage: Secondary | ICD-10-CM | POA: Diagnosis not present

## 2015-08-22 DIAGNOSIS — E1165 Type 2 diabetes mellitus with hyperglycemia: Secondary | ICD-10-CM | POA: Diagnosis not present

## 2015-08-22 DIAGNOSIS — I10 Essential (primary) hypertension: Secondary | ICD-10-CM | POA: Diagnosis not present

## 2015-08-22 DIAGNOSIS — Z23 Encounter for immunization: Secondary | ICD-10-CM | POA: Diagnosis not present

## 2015-08-22 DIAGNOSIS — R109 Unspecified abdominal pain: Secondary | ICD-10-CM | POA: Diagnosis not present

## 2015-08-26 DIAGNOSIS — I495 Sick sinus syndrome: Secondary | ICD-10-CM | POA: Diagnosis not present

## 2015-12-03 DIAGNOSIS — Z9181 History of falling: Secondary | ICD-10-CM | POA: Insufficient documentation

## 2015-12-30 ENCOUNTER — Ambulatory Visit (INDEPENDENT_AMBULATORY_CARE_PROVIDER_SITE_OTHER): Payer: Medicare HMO | Admitting: Podiatry

## 2015-12-30 ENCOUNTER — Encounter: Payer: Self-pay | Admitting: Podiatry

## 2015-12-30 VITALS — BP 126/74 | HR 69 | Resp 18

## 2015-12-30 DIAGNOSIS — L84 Corns and callosities: Secondary | ICD-10-CM

## 2015-12-30 DIAGNOSIS — M2010 Hallux valgus (acquired), unspecified foot: Secondary | ICD-10-CM

## 2015-12-30 DIAGNOSIS — E1149 Type 2 diabetes mellitus with other diabetic neurological complication: Secondary | ICD-10-CM | POA: Diagnosis not present

## 2015-12-30 NOTE — Progress Notes (Signed)
   Subjective:    Patient ID: Andrew Hurt., male    DOB: [birth year], 75 y.o.   MRN: [id number]  HPI  The 73-year-old male presents the office today requesting diabetic shoes. He's had them previously but they're several years old. He states he has calluses to both of his feet along the bunions. Denies any open sores. He gets some occasional numbness to his feet. No other complaints at this time.    Review of Systems  All other systems reviewed and are negative.      Objective:   Physical Exam General: AAO x3, NAD  Dermatological: Hypertrophic keratotic lesions bilateral medial first MTPJ overlying a mild bunion. Upon debridement no underlying ulceration, drainage or other signs of infection. There is no other open lesions or pre-ulcerative lesions identified bilaterally.  Vascular: Dorsalis Pedis artery and Posterior Tibial artery pedal pulses are 2/4 bilateral with immedate capillary fill time. Pedal hair growth present. No varicosities and no lower extremity edema present bilateral. There is no pain with calf compression, swelling, warmth, erythema.   Neruologic: Sensation intact with Derrel Nip monofilament, decreased vibratory sensation.   Musculoskeletal: HAV is present bilaterally. No areas of pinpoint bony tenderness. MMT 5/5, range of motion intact.   Gait: Unassisted, Nonantalgic.      Assessment & Plan:  75 year old male requesting diabetic shoes with HAV, pre-ulcerative calluses, neuropathy -Treatment options discussed including all alternatives, risks, and complications - Given symptoms that he believes he developed other issues. Paperwork completed today for precertification. -Hyperkeratotic lesions 2 without any complications or bleeding. -Follow-up after the approval of diabetic shoes or sooner if any issues are to arise.  Celesta Gentile, DPM

## 2016-02-16 ENCOUNTER — Telehealth: Payer: Self-pay | Admitting: Podiatry

## 2016-02-16 NOTE — Telephone Encounter (Signed)
Patients wife called the office asking if we ever received the order from Dr. Pennie Banter office for the diabetic shoes. Per Caryl Pina, the order was received here but Dr. Pennie Banter name was marked out by someone and then Dr. Almetta Lovely name was signed to the order and [facility] would not accept this because it had been altered. I explained this to the patients wife and advise her that we would have top resend the letter to [facility] in the name of Dr. Suzette Battiest so that she could sign the order for the patient. Wife has asked [place] to prepare the document and then call her so she can pick it up and hand deliver it to the office of Dr. Shelia Media Dr. Suzette Battiest for signature. She said that Dr. Suzette Battiest was the doctor that her husband saw that ordered the diabetic shoes not Dr. Shelia Media. However, the patient sees both doctors as his PCP. Please call her when the letter is ready for pick up.

## 2016-04-07 ENCOUNTER — Ambulatory Visit (INDEPENDENT_AMBULATORY_CARE_PROVIDER_SITE_OTHER): Payer: Medicare HMO | Admitting: *Deleted

## 2016-04-07 DIAGNOSIS — L84 Corns and callosities: Secondary | ICD-10-CM

## 2016-04-07 DIAGNOSIS — E1149 Type 2 diabetes mellitus with other diabetic neurological complication: Secondary | ICD-10-CM

## 2016-04-07 DIAGNOSIS — M2010 Hallux valgus (acquired), unspecified foot: Secondary | ICD-10-CM

## 2016-04-07 NOTE — Progress Notes (Signed)
Measured for diabetic shoes and insoles. 

## 2016-06-09 ENCOUNTER — Ambulatory Visit (INDEPENDENT_AMBULATORY_CARE_PROVIDER_SITE_OTHER): Payer: Medicare HMO | Admitting: Podiatry

## 2016-06-09 DIAGNOSIS — M2010 Hallux valgus (acquired), unspecified foot: Secondary | ICD-10-CM

## 2016-06-09 DIAGNOSIS — E1149 Type 2 diabetes mellitus with other diabetic neurological complication: Secondary | ICD-10-CM

## 2016-06-09 DIAGNOSIS — L84 Corns and callosities: Secondary | ICD-10-CM

## 2016-06-17 NOTE — Progress Notes (Signed)
Patient presents today to [facility]. Shoes and inserts were dispensed and had a good fit. Break in instructions discussed. Follow-up as scheduled. No new concerns.

## 2016-07-22 ENCOUNTER — Ambulatory Visit: Payer: Medicare HMO | Admitting: Podiatry

## 2016-10-05 DIAGNOSIS — H401133 Primary open-angle glaucoma, bilateral, severe stage: Secondary | ICD-10-CM | POA: Diagnosis not present

## 2016-10-19 DIAGNOSIS — I495 Sick sinus syndrome: Secondary | ICD-10-CM | POA: Diagnosis not present

## 2016-12-29 DIAGNOSIS — Z8546 Personal history of malignant neoplasm of prostate: Secondary | ICD-10-CM | POA: Diagnosis not present

## 2017-01-04 DIAGNOSIS — H401133 Primary open-angle glaucoma, bilateral, severe stage: Secondary | ICD-10-CM | POA: Diagnosis not present

## 2017-03-10 DIAGNOSIS — E7800 Pure hypercholesterolemia, unspecified: Secondary | ICD-10-CM | POA: Diagnosis not present

## 2017-03-10 DIAGNOSIS — E1165 Type 2 diabetes mellitus with hyperglycemia: Secondary | ICD-10-CM | POA: Diagnosis not present

## 2017-03-10 DIAGNOSIS — I10 Essential (primary) hypertension: Secondary | ICD-10-CM | POA: Diagnosis not present

## 2017-03-10 DIAGNOSIS — J069 Acute upper respiratory infection, unspecified: Secondary | ICD-10-CM | POA: Diagnosis not present

## 2017-03-23 DIAGNOSIS — J309 Allergic rhinitis, unspecified: Secondary | ICD-10-CM | POA: Diagnosis not present

## 2017-03-23 DIAGNOSIS — J310 Chronic rhinitis: Secondary | ICD-10-CM | POA: Diagnosis not present

## 2017-03-23 DIAGNOSIS — R0982 Postnasal drip: Secondary | ICD-10-CM | POA: Diagnosis not present

## 2017-03-23 DIAGNOSIS — J342 Deviated nasal septum: Secondary | ICD-10-CM | POA: Diagnosis not present

## 2017-03-23 DIAGNOSIS — R05 Cough: Secondary | ICD-10-CM | POA: Diagnosis not present

## 2017-04-04 DIAGNOSIS — H401133 Primary open-angle glaucoma, bilateral, severe stage: Secondary | ICD-10-CM | POA: Diagnosis not present

## 2017-04-11 DIAGNOSIS — H401133 Primary open-angle glaucoma, bilateral, severe stage: Secondary | ICD-10-CM | POA: Diagnosis not present

## 2017-04-21 DIAGNOSIS — I495 Sick sinus syndrome: Secondary | ICD-10-CM | POA: Diagnosis not present

## 2017-04-21 DIAGNOSIS — I208 Other forms of angina pectoris: Secondary | ICD-10-CM | POA: Diagnosis not present

## 2017-04-21 DIAGNOSIS — E782 Mixed hyperlipidemia: Secondary | ICD-10-CM | POA: Diagnosis not present

## 2017-04-21 DIAGNOSIS — I2510 Atherosclerotic heart disease of native coronary artery without angina pectoris: Secondary | ICD-10-CM | POA: Diagnosis not present

## 2017-04-21 DIAGNOSIS — E119 Type 2 diabetes mellitus without complications: Secondary | ICD-10-CM | POA: Diagnosis not present

## 2017-04-21 DIAGNOSIS — I10 Essential (primary) hypertension: Secondary | ICD-10-CM | POA: Diagnosis not present

## 2017-04-21 DIAGNOSIS — Z8659 Personal history of other mental and behavioral disorders: Secondary | ICD-10-CM | POA: Diagnosis not present

## 2017-05-10 DIAGNOSIS — R05 Cough: Secondary | ICD-10-CM | POA: Diagnosis not present

## 2017-05-10 DIAGNOSIS — N529 Male erectile dysfunction, unspecified: Secondary | ICD-10-CM | POA: Diagnosis not present

## 2017-05-10 DIAGNOSIS — R0982 Postnasal drip: Secondary | ICD-10-CM | POA: Diagnosis not present

## 2017-05-10 DIAGNOSIS — I2510 Atherosclerotic heart disease of native coronary artery without angina pectoris: Secondary | ICD-10-CM | POA: Diagnosis not present

## 2017-05-10 DIAGNOSIS — Z8546 Personal history of malignant neoplasm of prostate: Secondary | ICD-10-CM | POA: Diagnosis not present

## 2017-05-10 DIAGNOSIS — M545 Low back pain: Secondary | ICD-10-CM | POA: Diagnosis not present

## 2017-05-10 DIAGNOSIS — I10 Essential (primary) hypertension: Secondary | ICD-10-CM | POA: Diagnosis not present

## 2017-05-10 DIAGNOSIS — Z950 Presence of cardiac pacemaker: Secondary | ICD-10-CM | POA: Diagnosis not present

## 2017-05-10 DIAGNOSIS — J310 Chronic rhinitis: Secondary | ICD-10-CM | POA: Diagnosis not present

## 2017-05-10 DIAGNOSIS — J309 Allergic rhinitis, unspecified: Secondary | ICD-10-CM | POA: Diagnosis not present

## 2017-05-10 DIAGNOSIS — J324 Chronic pansinusitis: Secondary | ICD-10-CM | POA: Diagnosis not present

## 2017-05-10 DIAGNOSIS — Z7901 Long term (current) use of anticoagulants: Secondary | ICD-10-CM | POA: Diagnosis not present

## 2017-05-10 DIAGNOSIS — K219 Gastro-esophageal reflux disease without esophagitis: Secondary | ICD-10-CM | POA: Diagnosis not present

## 2017-05-10 DIAGNOSIS — E1149 Type 2 diabetes mellitus with other diabetic neurological complication: Secondary | ICD-10-CM | POA: Diagnosis not present

## 2017-05-10 DIAGNOSIS — J342 Deviated nasal septum: Secondary | ICD-10-CM | POA: Diagnosis not present

## 2017-05-10 DIAGNOSIS — E7800 Pure hypercholesterolemia, unspecified: Secondary | ICD-10-CM | POA: Diagnosis not present

## 2017-05-10 DIAGNOSIS — F329 Major depressive disorder, single episode, unspecified: Secondary | ICD-10-CM | POA: Diagnosis not present

## 2017-05-10 DIAGNOSIS — J328 Other chronic sinusitis: Secondary | ICD-10-CM | POA: Diagnosis not present

## 2017-05-10 DIAGNOSIS — G250 Essential tremor: Secondary | ICD-10-CM | POA: Diagnosis not present

## 2017-05-10 DIAGNOSIS — H409 Unspecified glaucoma: Secondary | ICD-10-CM | POA: Diagnosis not present

## 2017-05-13 DIAGNOSIS — R319 Hematuria, unspecified: Secondary | ICD-10-CM | POA: Diagnosis not present

## 2017-07-11 DIAGNOSIS — Z23 Encounter for immunization: Secondary | ICD-10-CM | POA: Diagnosis not present

## 2017-07-15 DIAGNOSIS — I10 Essential (primary) hypertension: Secondary | ICD-10-CM | POA: Diagnosis not present

## 2017-07-15 DIAGNOSIS — E1149 Type 2 diabetes mellitus with other diabetic neurological complication: Secondary | ICD-10-CM | POA: Diagnosis not present

## 2017-07-15 DIAGNOSIS — L659 Nonscarring hair loss, unspecified: Secondary | ICD-10-CM | POA: Diagnosis not present

## 2017-07-15 DIAGNOSIS — E7800 Pure hypercholesterolemia, unspecified: Secondary | ICD-10-CM | POA: Diagnosis not present

## 2017-07-15 DIAGNOSIS — E1165 Type 2 diabetes mellitus with hyperglycemia: Secondary | ICD-10-CM | POA: Diagnosis not present

## 2017-07-15 DIAGNOSIS — T783XXA Angioneurotic edema, initial encounter: Secondary | ICD-10-CM | POA: Diagnosis not present

## 2017-08-08 DIAGNOSIS — E119 Type 2 diabetes mellitus without complications: Secondary | ICD-10-CM | POA: Diagnosis not present

## 2017-08-09 DIAGNOSIS — I10 Essential (primary) hypertension: Secondary | ICD-10-CM | POA: Diagnosis not present

## 2017-08-09 DIAGNOSIS — E1149 Type 2 diabetes mellitus with other diabetic neurological complication: Secondary | ICD-10-CM | POA: Diagnosis not present

## 2017-10-11 DIAGNOSIS — Z8659 Personal history of other mental and behavioral disorders: Secondary | ICD-10-CM | POA: Diagnosis not present

## 2017-10-11 DIAGNOSIS — E119 Type 2 diabetes mellitus without complications: Secondary | ICD-10-CM | POA: Diagnosis not present

## 2017-10-11 DIAGNOSIS — I10 Essential (primary) hypertension: Secondary | ICD-10-CM | POA: Diagnosis not present

## 2017-10-11 DIAGNOSIS — E782 Mixed hyperlipidemia: Secondary | ICD-10-CM | POA: Diagnosis not present

## 2017-10-11 DIAGNOSIS — Z955 Presence of coronary angioplasty implant and graft: Secondary | ICD-10-CM | POA: Diagnosis not present

## 2017-10-11 DIAGNOSIS — I208 Other forms of angina pectoris: Secondary | ICD-10-CM | POA: Diagnosis not present

## 2017-10-11 DIAGNOSIS — R001 Bradycardia, unspecified: Secondary | ICD-10-CM | POA: Diagnosis not present

## 2017-10-11 DIAGNOSIS — I495 Sick sinus syndrome: Secondary | ICD-10-CM | POA: Diagnosis not present

## 2017-10-11 DIAGNOSIS — I2510 Atherosclerotic heart disease of native coronary artery without angina pectoris: Secondary | ICD-10-CM | POA: Diagnosis not present

## 2017-11-08 DIAGNOSIS — R0982 Postnasal drip: Secondary | ICD-10-CM | POA: Diagnosis not present

## 2017-11-08 DIAGNOSIS — J320 Chronic maxillary sinusitis: Secondary | ICD-10-CM | POA: Diagnosis not present

## 2017-11-08 DIAGNOSIS — G4733 Obstructive sleep apnea (adult) (pediatric): Secondary | ICD-10-CM | POA: Diagnosis not present

## 2017-11-08 DIAGNOSIS — J301 Allergic rhinitis due to pollen: Secondary | ICD-10-CM | POA: Diagnosis not present

## 2017-11-26 DIAGNOSIS — G4733 Obstructive sleep apnea (adult) (pediatric): Secondary | ICD-10-CM | POA: Diagnosis not present

## 2017-12-06 DIAGNOSIS — H401133 Primary open-angle glaucoma, bilateral, severe stage: Secondary | ICD-10-CM | POA: Diagnosis not present

## 2017-12-20 DIAGNOSIS — E119 Type 2 diabetes mellitus without complications: Secondary | ICD-10-CM | POA: Diagnosis not present

## 2017-12-27 DIAGNOSIS — H2513 Age-related nuclear cataract, bilateral: Secondary | ICD-10-CM | POA: Diagnosis not present

## 2017-12-27 DIAGNOSIS — H401133 Primary open-angle glaucoma, bilateral, severe stage: Secondary | ICD-10-CM | POA: Diagnosis not present

## 2018-01-05 DIAGNOSIS — E119 Type 2 diabetes mellitus without complications: Secondary | ICD-10-CM | POA: Diagnosis not present

## 2018-01-05 DIAGNOSIS — H401133 Primary open-angle glaucoma, bilateral, severe stage: Secondary | ICD-10-CM | POA: Diagnosis not present

## 2018-01-05 DIAGNOSIS — H2511 Age-related nuclear cataract, right eye: Secondary | ICD-10-CM | POA: Diagnosis not present

## 2018-01-05 DIAGNOSIS — H401113 Primary open-angle glaucoma, right eye, severe stage: Secondary | ICD-10-CM | POA: Diagnosis not present

## 2018-01-05 DIAGNOSIS — Z794 Long term (current) use of insulin: Secondary | ICD-10-CM | POA: Diagnosis not present

## 2018-01-30 DIAGNOSIS — I10 Essential (primary) hypertension: Secondary | ICD-10-CM | POA: Diagnosis not present

## 2018-01-30 DIAGNOSIS — E1165 Type 2 diabetes mellitus with hyperglycemia: Secondary | ICD-10-CM | POA: Diagnosis not present

## 2018-01-30 DIAGNOSIS — E1149 Type 2 diabetes mellitus with other diabetic neurological complication: Secondary | ICD-10-CM | POA: Diagnosis not present

## 2018-01-30 DIAGNOSIS — E7800 Pure hypercholesterolemia, unspecified: Secondary | ICD-10-CM | POA: Diagnosis not present

## 2018-04-04 DIAGNOSIS — H401133 Primary open-angle glaucoma, bilateral, severe stage: Secondary | ICD-10-CM | POA: Diagnosis not present

## 2018-04-25 DIAGNOSIS — I495 Sick sinus syndrome: Secondary | ICD-10-CM | POA: Diagnosis not present

## 2018-05-02 DIAGNOSIS — E1165 Type 2 diabetes mellitus with hyperglycemia: Secondary | ICD-10-CM | POA: Diagnosis not present

## 2018-05-02 DIAGNOSIS — E7800 Pure hypercholesterolemia, unspecified: Secondary | ICD-10-CM | POA: Diagnosis not present

## 2018-05-02 DIAGNOSIS — I10 Essential (primary) hypertension: Secondary | ICD-10-CM | POA: Diagnosis not present

## 2018-05-02 DIAGNOSIS — E1149 Type 2 diabetes mellitus with other diabetic neurological complication: Secondary | ICD-10-CM | POA: Diagnosis not present

## 2018-06-15 DIAGNOSIS — E7800 Pure hypercholesterolemia, unspecified: Secondary | ICD-10-CM | POA: Diagnosis not present

## 2018-06-15 DIAGNOSIS — Z23 Encounter for immunization: Secondary | ICD-10-CM | POA: Diagnosis not present

## 2018-06-15 DIAGNOSIS — E1149 Type 2 diabetes mellitus with other diabetic neurological complication: Secondary | ICD-10-CM | POA: Diagnosis not present

## 2018-07-11 DIAGNOSIS — H2512 Age-related nuclear cataract, left eye: Secondary | ICD-10-CM | POA: Diagnosis not present

## 2018-07-11 DIAGNOSIS — H401133 Primary open-angle glaucoma, bilateral, severe stage: Secondary | ICD-10-CM | POA: Diagnosis not present

## 2018-08-06 ENCOUNTER — Emergency Department: Payer: No Typology Code available for payment source

## 2018-08-06 ENCOUNTER — Other Ambulatory Visit: Payer: Self-pay

## 2018-08-06 ENCOUNTER — Observation Stay
Admission: EM | Admit: 2018-08-06 | Discharge: 2018-08-07 | Disposition: A | Discharge status: Home / Self Care | Payer: No Typology Code available for payment source | Attending: Internal Medicine | Admitting: Internal Medicine

## 2018-08-06 ENCOUNTER — Encounter: Payer: Self-pay | Admitting: Emergency Medicine

## 2018-08-06 DIAGNOSIS — M546 Pain in thoracic spine: Secondary | ICD-10-CM | POA: Diagnosis not present

## 2018-08-06 DIAGNOSIS — E119 Type 2 diabetes mellitus without complications: Secondary | ICD-10-CM | POA: Insufficient documentation

## 2018-08-06 DIAGNOSIS — F17200 Nicotine dependence, unspecified, uncomplicated: Secondary | ICD-10-CM | POA: Insufficient documentation

## 2018-08-06 DIAGNOSIS — Y92411 Interstate highway as the place of occurrence of the external cause: Secondary | ICD-10-CM | POA: Diagnosis not present

## 2018-08-06 DIAGNOSIS — Z7982 Long term (current) use of aspirin: Secondary | ICD-10-CM | POA: Insufficient documentation

## 2018-08-06 DIAGNOSIS — M545 Low back pain: Secondary | ICD-10-CM | POA: Insufficient documentation

## 2018-08-06 DIAGNOSIS — F329 Major depressive disorder, single episode, unspecified: Secondary | ICD-10-CM | POA: Diagnosis not present

## 2018-08-06 DIAGNOSIS — R778 Other specified abnormalities of plasma proteins: Secondary | ICD-10-CM

## 2018-08-06 DIAGNOSIS — Z950 Presence of cardiac pacemaker: Secondary | ICD-10-CM | POA: Insufficient documentation

## 2018-08-06 DIAGNOSIS — E782 Mixed hyperlipidemia: Secondary | ICD-10-CM | POA: Diagnosis not present

## 2018-08-06 DIAGNOSIS — Z79899 Other long term (current) drug therapy: Secondary | ICD-10-CM | POA: Diagnosis not present

## 2018-08-06 DIAGNOSIS — S199XXA Unspecified injury of neck, initial encounter: Secondary | ICD-10-CM | POA: Diagnosis not present

## 2018-08-06 DIAGNOSIS — Z794 Long term (current) use of insulin: Secondary | ICD-10-CM | POA: Insufficient documentation

## 2018-08-06 DIAGNOSIS — T1490XA Injury, unspecified, initial encounter: Secondary | ICD-10-CM

## 2018-08-06 DIAGNOSIS — S299XXA Unspecified injury of thorax, initial encounter: Secondary | ICD-10-CM | POA: Diagnosis not present

## 2018-08-06 DIAGNOSIS — I10 Essential (primary) hypertension: Secondary | ICD-10-CM | POA: Diagnosis not present

## 2018-08-06 DIAGNOSIS — R079 Chest pain, unspecified: Secondary | ICD-10-CM

## 2018-08-06 DIAGNOSIS — S0990XA Unspecified injury of head, initial encounter: Secondary | ICD-10-CM | POA: Diagnosis not present

## 2018-08-06 DIAGNOSIS — G4730 Sleep apnea, unspecified: Secondary | ICD-10-CM | POA: Diagnosis not present

## 2018-08-06 DIAGNOSIS — S298XXA Other specified injuries of thorax, initial encounter: Secondary | ICD-10-CM

## 2018-08-06 DIAGNOSIS — M542 Cervicalgia: Secondary | ICD-10-CM | POA: Diagnosis not present

## 2018-08-06 DIAGNOSIS — R7989 Other specified abnormal findings of blood chemistry: Secondary | ICD-10-CM | POA: Diagnosis not present

## 2018-08-06 DIAGNOSIS — R9431 Abnormal electrocardiogram [ECG] [EKG]: Secondary | ICD-10-CM | POA: Diagnosis not present

## 2018-08-06 DIAGNOSIS — R748 Abnormal levels of other serum enzymes: Secondary | ICD-10-CM | POA: Diagnosis not present

## 2018-08-06 DIAGNOSIS — I495 Sick sinus syndrome: Secondary | ICD-10-CM | POA: Diagnosis not present

## 2018-08-06 DIAGNOSIS — S3991XA Unspecified injury of abdomen, initial encounter: Secondary | ICD-10-CM | POA: Diagnosis not present

## 2018-08-06 DIAGNOSIS — R1010 Upper abdominal pain, unspecified: Secondary | ICD-10-CM | POA: Diagnosis not present

## 2018-08-06 LAB — COMPREHENSIVE METABOLIC PANEL
ALT: 34 U/L (ref 0–44)
ANION GAP: 5 (ref 5–15)
AST: 39 U/L (ref 15–41)
Albumin: 3.7 g/dL (ref 3.5–5.0)
Alkaline Phosphatase: 69 U/L (ref 38–126)
BUN: 26 mg/dL — ABNORMAL HIGH (ref 8–23)
CHLORIDE: 107 mmol/L (ref 98–111)
CO2: 25 mmol/L (ref 22–32)
CREATININE: 0.79 mg/dL (ref 0.61–1.24)
Calcium: 9.7 mg/dL (ref 8.9–10.3)
Glucose, Bld: 153 mg/dL — ABNORMAL HIGH (ref 70–99)
Potassium: 4.5 mmol/L (ref 3.5–5.1)
Sodium: 137 mmol/L (ref 135–145)
Total Bilirubin: 0.8 mg/dL (ref 0.3–1.2)
Total Protein: 6.4 g/dL — ABNORMAL LOW (ref 6.5–8.1)

## 2018-08-06 LAB — CBC WITH DIFFERENTIAL/PLATELET
ABS IMMATURE GRANULOCYTES: 0.03 10*3/uL (ref 0.00–0.07)
BASOS PCT: 0 %
Basophils Absolute: 0 10*3/uL (ref 0.0–0.1)
Eosinophils Absolute: 1.1 10*3/uL — ABNORMAL HIGH (ref 0.0–0.5)
Eosinophils Relative: 16 %
HCT: 42.4 % (ref 39.0–52.0)
Hemoglobin: 13.7 g/dL (ref 13.0–17.0)
Immature Granulocytes: 0 %
Lymphocytes Relative: 30 %
Lymphs Abs: 2 10*3/uL (ref 0.7–4.0)
MCH: 27.4 pg (ref 26.0–34.0)
MCHC: 32.3 g/dL (ref 30.0–36.0)
MCV: 84.8 fL (ref 80.0–100.0)
MONO ABS: 0.5 10*3/uL (ref 0.1–1.0)
Monocytes Relative: 7 %
NEUTROS ABS: 3.1 10*3/uL (ref 1.7–7.7)
NRBC: 0 % (ref 0.0–0.2)
Neutrophils Relative %: 47 %
PLATELETS: 168 10*3/uL (ref 150–400)
RBC: 5 MIL/uL (ref 4.22–5.81)
RDW: 14.7 % (ref 11.5–15.5)
WBC: 6.8 10*3/uL (ref 4.0–10.5)

## 2018-08-06 LAB — URINALYSIS, COMPLETE (UACMP) WITH MICROSCOPIC
Bacteria, UA: NONE SEEN
Bilirubin Urine: NEGATIVE
GLUCOSE, UA: 150 mg/dL — AB
Ketones, ur: NEGATIVE mg/dL
Leukocytes, UA: NEGATIVE
Nitrite: NEGATIVE
Protein, ur: NEGATIVE mg/dL
Specific Gravity, Urine: 1.019 (ref 1.005–1.030)
pH: 6 (ref 5.0–8.0)

## 2018-08-06 LAB — GLUCOSE, CAPILLARY: GLUCOSE-CAPILLARY: 217 mg/dL — AB (ref 70–99)

## 2018-08-06 LAB — TROPONIN I
Troponin I: 0.03 ng/mL (ref <0.03)
Troponin I: 0.03 ng/mL (ref <0.03)

## 2018-08-06 LAB — HEMOGLOBIN A1C
Hgb A1c MFr Bld: 7.7 % — ABNORMAL HIGH (ref 4.8–5.6)
Mean Plasma Glucose: 174.29 mg/dL

## 2018-08-06 MED ORDER — SODIUM CHLORIDE 0.9% FLUSH
3.0000 mL | Freq: Two times a day (BID) | INTRAVENOUS | Status: DC
  Administered 2018-08-06 – 2018-08-07 (×2): 3 mL via INTRAVENOUS

## 2018-08-06 MED ORDER — INSULIN ASPART 100 UNIT/ML ~~LOC~~ SOLN
3.0000 [IU] | Freq: Three times a day (TID) | SUBCUTANEOUS | Status: DC
  Administered 2018-08-07 (×2): 3 [IU] via SUBCUTANEOUS
  Filled 2018-08-06 (×2): qty 1

## 2018-08-06 MED ORDER — ONDANSETRON HCL 4 MG PO TABS: 4.0000 mg | ORAL_TABLET | Freq: Four times a day (QID) | ORAL | Status: DC | PRN

## 2018-08-06 MED ORDER — ONDANSETRON HCL 4 MG/2ML IJ SOLN: 4.0000 mg | Freq: Four times a day (QID) | INTRAMUSCULAR | Status: DC | PRN

## 2018-08-06 MED ORDER — INSULIN ASPART 100 UNIT/ML ~~LOC~~ SOLN
0.0000 [IU] | Freq: Three times a day (TID) | SUBCUTANEOUS | Status: DC
  Administered 2018-08-07: 2 [IU] via SUBCUTANEOUS
  Administered 2018-08-07: 3 [IU] via SUBCUTANEOUS
  Filled 2018-08-06 (×2): qty 1

## 2018-08-06 MED ORDER — IBUPROFEN 400 MG PO TABS: 400.0000 mg | ORAL_TABLET | Freq: Four times a day (QID) | ORAL | Status: DC | PRN

## 2018-08-06 MED ORDER — ATORVASTATIN CALCIUM 20 MG PO TABS
40.0000 mg | ORAL_TABLET | Freq: Every day | ORAL | Status: DC
  Administered 2018-08-06 – 2018-08-07 (×2): 40 mg via ORAL
  Filled 2018-08-06 (×2): qty 2

## 2018-08-06 MED ORDER — ACETAMINOPHEN 325 MG PO TABS: 650.0000 mg | ORAL_TABLET | Freq: Four times a day (QID) | ORAL | Status: DC | PRN

## 2018-08-06 MED ORDER — INSULIN DEGLUDEC 100 UNIT/ML ~~LOC~~ SOPN: 18.0000 [IU] | PEN_INJECTOR | Freq: Every day | SUBCUTANEOUS | Status: DC

## 2018-08-06 MED ORDER — MORPHINE SULFATE (PF) 4 MG/ML IV SOLN
4.0000 mg | Freq: Once | INTRAVENOUS | Status: AC
  Administered 2018-08-06: 4 mg via INTRAVENOUS
  Filled 2018-08-06: qty 1

## 2018-08-06 MED ORDER — SODIUM CHLORIDE 0.9 % IV BOLUS
1000.0000 mL | Freq: Once | INTRAVENOUS | Status: AC
  Administered 2018-08-06: 1000 mL via INTRAVENOUS

## 2018-08-06 MED ORDER — IOPAMIDOL (ISOVUE-300) INJECTION 61%
100.0000 mL | Freq: Once | INTRAVENOUS | Status: AC | PRN
  Administered 2018-08-06: 100 mL via INTRAVENOUS
  Filled 2018-08-06: qty 100

## 2018-08-06 MED ORDER — HYDROCODONE-ACETAMINOPHEN 7.5-325 MG PO TABS
1.0000 | ORAL_TABLET | Freq: Four times a day (QID) | ORAL | Status: DC | PRN
  Administered 2018-08-06: 1 via ORAL
  Filled 2018-08-06: qty 1

## 2018-08-06 MED ORDER — ASPIRIN EC 81 MG PO TBEC
81.0000 mg | DELAYED_RELEASE_TABLET | Freq: Every day | ORAL | Status: DC
  Administered 2018-08-06 – 2018-08-07 (×2): 81 mg via ORAL
  Filled 2018-08-06 (×3): qty 1

## 2018-08-06 MED ORDER — ALBUTEROL SULFATE (2.5 MG/3ML) 0.083% IN NEBU: 2.5000 mg | INHALATION_SOLUTION | RESPIRATORY_TRACT | Status: DC | PRN

## 2018-08-06 MED ORDER — ONDANSETRON HCL 4 MG/2ML IJ SOLN
4.0000 mg | Freq: Once | INTRAMUSCULAR | Status: AC
Start: 2018-08-06 — End: 2018-08-06
  Administered 2018-08-06: 4 mg via INTRAVENOUS
  Filled 2018-08-06: qty 2

## 2018-08-06 MED ORDER — POLYETHYLENE GLYCOL 3350 17 G PO PACK: 17.0000 g | PACK | Freq: Every day | ORAL | Status: DC | PRN

## 2018-08-06 MED ORDER — INSULIN GLARGINE 100 UNIT/ML ~~LOC~~ SOLN
18.0000 [IU] | Freq: Every day | SUBCUTANEOUS | Status: DC
  Administered 2018-08-06: 18 [IU] via SUBCUTANEOUS
  Filled 2018-08-06 (×2): qty 0.18

## 2018-08-06 MED ORDER — INSULIN ASPART 100 UNIT/ML ~~LOC~~ SOLN
0.0000 [IU] | Freq: Every day | SUBCUTANEOUS | Status: DC
  Administered 2018-08-06: 2 [IU] via SUBCUTANEOUS
  Filled 2018-08-06: qty 1

## 2018-08-06 MED ORDER — ACETAMINOPHEN 650 MG RE SUPP: 650.0000 mg | Freq: Four times a day (QID) | RECTAL | Status: DC | PRN

## 2018-08-06 MED ORDER — LOSARTAN POTASSIUM 25 MG PO TABS
12.5000 mg | ORAL_TABLET | Freq: Every day | ORAL | Status: DC
  Administered 2018-08-06 – 2018-08-07 (×2): 12.5 mg via ORAL
  Filled 2018-08-06 (×2): qty 1

## 2018-08-06 MED ORDER — ENOXAPARIN SODIUM 40 MG/0.4ML ~~LOC~~ SOLN
40.0000 mg | SUBCUTANEOUS | Status: DC
  Administered 2018-08-06: 40 mg via SUBCUTANEOUS
  Filled 2018-08-06: qty 0.4

## 2018-08-06 NOTE — Plan of Care (Signed)
Pt will be informed of any test performed and updated every shift.
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
## 2018-08-06 NOTE — ED Provider Notes (Addendum)
-----------------------------------------   12:46 PM on 08/06/2018 -----------------------------------------   EKG reviewed and interpreted by myself shows a paced rhythm at 62 bpm with a prolonged PR interval, widened QRS, largely normal axis with otherwise normal intervals nonspecific ST changes.  Compared to prior EKG 10/07/2014, morphology is largely unchanged.   Harvest Dark, MD 08/06/18 1246  Repeat EKG 2: 26: 48 shows a paced rhythm at 60 bpm with a widened QRS, normal axis, largely normal intervals.  However patient does appear to now have inferolateral ST depression with T wave inversion, which is changed from his initial EKG.  We will discuss with cardiology for further recommendations.  Troponin is 0.03.  Patient does have mild chest discomfort although this is likely trauma induced.  CT scan of the chest has been performed.  Given the new T wave inversions with slight troponin elevation compared to initial troponin, the concern would be for possible cardiac contusion.  I have personally seen and evaluated the patient he continues to have pain across the lower chest.  Given his EKG changes will admit to the hospitalist service for continued monitoring.  Patient agreeable to plan of care.    Harvest Dark, MD 08/06/18 [phone]

## 2018-08-06 NOTE — ED Notes (Signed)
cbg  154 per EMS
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
## 2018-08-07 ENCOUNTER — Other Ambulatory Visit: Payer: Self-pay

## 2018-08-07 ENCOUNTER — Observation Stay
Admit: 2018-08-07 | Discharge: 2018-08-07 | Disposition: A | Discharge status: Home / Self Care | Payer: No Typology Code available for payment source | Attending: Internal Medicine | Admitting: Internal Medicine

## 2018-08-07 DIAGNOSIS — R079 Chest pain, unspecified: Secondary | ICD-10-CM | POA: Diagnosis not present

## 2018-08-07 DIAGNOSIS — S299XXA Unspecified injury of thorax, initial encounter: Secondary | ICD-10-CM | POA: Diagnosis not present

## 2018-08-07 DIAGNOSIS — E119 Type 2 diabetes mellitus without complications: Secondary | ICD-10-CM | POA: Diagnosis not present

## 2018-08-07 DIAGNOSIS — I442 Atrioventricular block, complete: Secondary | ICD-10-CM | POA: Diagnosis not present

## 2018-08-07 DIAGNOSIS — I200 Unstable angina: Secondary | ICD-10-CM | POA: Diagnosis not present

## 2018-08-07 LAB — BASIC METABOLIC PANEL
ANION GAP: 7 (ref 5–15)
BUN: 21 mg/dL (ref 8–23)
CALCIUM: 9.2 mg/dL (ref 8.9–10.3)
CHLORIDE: 107 mmol/L (ref 98–111)
CO2: 23 mmol/L (ref 22–32)
CREATININE: 0.78 mg/dL (ref 0.61–1.24)
GFR calc non Af Amer: >60 mL/min (ref >60)
Glucose, Bld: 194 mg/dL — ABNORMAL HIGH (ref 70–99)
POTASSIUM: 4.1 mmol/L (ref 3.5–5.1)
Sodium: 137 mmol/L (ref 135–145)

## 2018-08-07 LAB — GLUCOSE, CAPILLARY
GLUCOSE-CAPILLARY: 176 mg/dL — AB (ref 70–99)
Glucose-Capillary: 224 mg/dL — ABNORMAL HIGH (ref 70–99)

## 2018-08-07 LAB — ECHOCARDIOGRAM COMPLETE
Height: 67.5 in
Weight: 2500.8 oz

## 2018-08-07 LAB — TROPONIN I: Troponin I: <0.03 ng/mL (ref <0.03)

## 2018-08-07 NOTE — Progress Notes (Signed)
Inpatient Diabetes Program Recommendations  AACE/ADA: New Consensus Statement on Inpatient Glycemic Control (2015)  Target Ranges:  Prepandial:   less than 140 mg/dL      Peak postprandial:   less than 180 mg/dL (1-2 hours)      Critically ill patients:  140 - 180 mg/dL   Lab Results  Component Value Date   GLUCAP 224 (H) 08/07/2018   HGBA1C 7.7 (H) 08/06/2018   Diabetes history: DM 2 Outpatient Diabetes medications:  Spoke with patient by phone and verified that he takes [place] 18 units daily and Humalog 6 units tid with meals Inpatient Diabetes Program Recommendations:   Note D/c summary.  Please d/c both Lantus and Novolog off d/c medications on D/c summary as patient is only taking [place] and Humalog at home.   Thanks,  Adah Perl, RN, BC-ADM Inpatient Diabetes Coordinator Pager [phone] (8a-5p)

## 2018-08-07 NOTE — Care Management Note (Signed)
Case Management Note  Patient Details  Name: Andrew Wu. MRN: [id number] Date of Birth: 17-Aug-1941  Subjective/Objective:    Independent in all adls, denies issues accessing medical care, obtaining medications or with transportation.  Current with PCP.  No discharge needs identified at present by care manager or members of care team.  Wife plans to transport patient to home.                    Action/Plan:   Expected Discharge Date:  08/07/18               Expected Discharge Plan:  Home/Self Care  In-House Referral:     Discharge planning Services  CM Consult  Post Acute Care Choice:    Choice offered to:     DME Arranged:    DME Agency:     HH Arranged:    HH Agency:     Status of Service:  Completed, signed off  If discussed at [employer] of Stay Meetings, dates discussed:    Additional Comments:  Elza Rafter, RN 08/07/2018, 9:37 AM

## 2018-08-07 NOTE — Care Management (Signed)
PT has recommended out patient PT.   Order signed and sent to out patient rehab.  They will call patient with appointment.

## 2018-08-07 NOTE — Progress Notes (Signed)
*  PRELIMINARY RESULTS* Echocardiogram 2D Echocardiogram has been performed.  Sherrie Sport 08/07/2018, 10:53 AM
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
## 2018-08-07 NOTE — Plan of Care (Signed)
  Problem: Clinical Measurements: Goal: Diagnostic test results will improve Outcome: Progressing Note:  Troponins negative, CT negative   Problem: Activity: Goal: Ability to tolerate increased activity will improve Outcome: Progressing   Problem: Cardiac: Goal: Ability to achieve and maintain adequate cardiovascular perfusion will improve Outcome: Progressing Note:  No chest pain or arrhythmias over night
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
## 2018-08-07 NOTE — Progress Notes (Signed)
Pt discharged to home via wc.  Instructions  given to pt.  Questions answered.  No distress.  

## 2018-08-11 DIAGNOSIS — M542 Cervicalgia: Secondary | ICD-10-CM | POA: Diagnosis not present

## 2018-08-14 DIAGNOSIS — I10 Essential (primary) hypertension: Secondary | ICD-10-CM | POA: Diagnosis not present

## 2018-08-14 DIAGNOSIS — I2510 Atherosclerotic heart disease of native coronary artery without angina pectoris: Secondary | ICD-10-CM | POA: Diagnosis not present

## 2018-08-14 DIAGNOSIS — Z955 Presence of coronary angioplasty implant and graft: Secondary | ICD-10-CM | POA: Diagnosis not present

## 2018-08-14 DIAGNOSIS — Z8659 Personal history of other mental and behavioral disorders: Secondary | ICD-10-CM | POA: Diagnosis not present

## 2018-08-14 DIAGNOSIS — R001 Bradycardia, unspecified: Secondary | ICD-10-CM | POA: Diagnosis not present

## 2018-08-14 DIAGNOSIS — S20212D Contusion of left front wall of thorax, subsequent encounter: Secondary | ICD-10-CM | POA: Diagnosis not present

## 2018-08-14 DIAGNOSIS — I495 Sick sinus syndrome: Secondary | ICD-10-CM | POA: Diagnosis not present

## 2018-08-14 DIAGNOSIS — E782 Mixed hyperlipidemia: Secondary | ICD-10-CM | POA: Diagnosis not present

## 2018-08-16 ENCOUNTER — Ambulatory Visit: Payer: Self-pay | Admitting: Physical Therapy

## 2018-08-16 DIAGNOSIS — M542 Cervicalgia: Secondary | ICD-10-CM | POA: Diagnosis not present

## 2018-08-18 DIAGNOSIS — M542 Cervicalgia: Secondary | ICD-10-CM | POA: Diagnosis not present

## 2018-08-21 ENCOUNTER — Other Ambulatory Visit: Payer: Self-pay

## 2018-08-22 DIAGNOSIS — M542 Cervicalgia: Secondary | ICD-10-CM | POA: Diagnosis not present

## 2018-08-23 DIAGNOSIS — E1165 Type 2 diabetes mellitus with hyperglycemia: Secondary | ICD-10-CM | POA: Diagnosis not present

## 2018-08-23 DIAGNOSIS — I10 Essential (primary) hypertension: Secondary | ICD-10-CM | POA: Diagnosis not present

## 2018-08-23 DIAGNOSIS — Z125 Encounter for screening for malignant neoplasm of prostate: Secondary | ICD-10-CM | POA: Diagnosis not present

## 2018-08-23 DIAGNOSIS — E7800 Pure hypercholesterolemia, unspecified: Secondary | ICD-10-CM | POA: Diagnosis not present

## 2018-08-28 DIAGNOSIS — R300 Dysuria: Secondary | ICD-10-CM | POA: Diagnosis not present

## 2018-08-28 DIAGNOSIS — E1165 Type 2 diabetes mellitus with hyperglycemia: Secondary | ICD-10-CM | POA: Diagnosis not present

## 2018-08-28 DIAGNOSIS — Z0000 Encounter for general adult medical examination without abnormal findings: Secondary | ICD-10-CM | POA: Diagnosis not present

## 2018-08-28 DIAGNOSIS — E7800 Pure hypercholesterolemia, unspecified: Secondary | ICD-10-CM | POA: Diagnosis not present

## 2018-08-28 DIAGNOSIS — I10 Essential (primary) hypertension: Secondary | ICD-10-CM | POA: Diagnosis not present

## 2018-08-29 DIAGNOSIS — M542 Cervicalgia: Secondary | ICD-10-CM | POA: Diagnosis not present

## 2018-09-04 DIAGNOSIS — R001 Bradycardia, unspecified: Secondary | ICD-10-CM | POA: Diagnosis not present

## 2018-09-04 DIAGNOSIS — I495 Sick sinus syndrome: Secondary | ICD-10-CM | POA: Diagnosis not present

## 2018-09-04 DIAGNOSIS — Z955 Presence of coronary angioplasty implant and graft: Secondary | ICD-10-CM | POA: Diagnosis not present

## 2018-09-04 DIAGNOSIS — I208 Other forms of angina pectoris: Secondary | ICD-10-CM | POA: Diagnosis not present

## 2018-09-04 DIAGNOSIS — E782 Mixed hyperlipidemia: Secondary | ICD-10-CM | POA: Diagnosis not present

## 2018-09-04 DIAGNOSIS — I2510 Atherosclerotic heart disease of native coronary artery without angina pectoris: Secondary | ICD-10-CM | POA: Diagnosis not present

## 2018-09-04 DIAGNOSIS — I10 Essential (primary) hypertension: Secondary | ICD-10-CM | POA: Diagnosis not present

## 2018-09-04 DIAGNOSIS — Z8659 Personal history of other mental and behavioral disorders: Secondary | ICD-10-CM | POA: Diagnosis not present

## 2018-09-04 DIAGNOSIS — E119 Type 2 diabetes mellitus without complications: Secondary | ICD-10-CM | POA: Diagnosis not present

## 2018-10-19 DIAGNOSIS — I10 Essential (primary) hypertension: Secondary | ICD-10-CM | POA: Diagnosis not present

## 2018-10-19 DIAGNOSIS — E1165 Type 2 diabetes mellitus with hyperglycemia: Secondary | ICD-10-CM | POA: Diagnosis not present

## 2018-10-24 DIAGNOSIS — H401133 Primary open-angle glaucoma, bilateral, severe stage: Secondary | ICD-10-CM | POA: Diagnosis not present

## 2019-01-23 DIAGNOSIS — H401133 Primary open-angle glaucoma, bilateral, severe stage: Secondary | ICD-10-CM | POA: Diagnosis not present

## 2019-02-21 DIAGNOSIS — E1165 Type 2 diabetes mellitus with hyperglycemia: Secondary | ICD-10-CM | POA: Diagnosis not present

## 2019-02-27 DIAGNOSIS — E7800 Pure hypercholesterolemia, unspecified: Secondary | ICD-10-CM | POA: Diagnosis not present

## 2019-03-02 DIAGNOSIS — I2510 Atherosclerotic heart disease of native coronary artery without angina pectoris: Secondary | ICD-10-CM | POA: Diagnosis not present

## 2019-03-02 DIAGNOSIS — F329 Major depressive disorder, single episode, unspecified: Secondary | ICD-10-CM | POA: Diagnosis not present

## 2019-03-02 DIAGNOSIS — E7800 Pure hypercholesterolemia, unspecified: Secondary | ICD-10-CM | POA: Diagnosis not present

## 2019-03-26 DIAGNOSIS — H2512 Age-related nuclear cataract, left eye: Secondary | ICD-10-CM | POA: Diagnosis not present

## 2019-03-26 DIAGNOSIS — H401133 Primary open-angle glaucoma, bilateral, severe stage: Secondary | ICD-10-CM | POA: Diagnosis not present

## 2019-03-26 DIAGNOSIS — E119 Type 2 diabetes mellitus without complications: Secondary | ICD-10-CM | POA: Diagnosis not present

## 2019-03-27 DIAGNOSIS — I495 Sick sinus syndrome: Secondary | ICD-10-CM | POA: Diagnosis not present

## 2019-04-24 ENCOUNTER — Emergency Department
Admission: EM | Admit: 2019-04-24 | Discharge: 2019-04-24 | Discharge status: Against Medical Advice | Payer: Medicare Other | Attending: Emergency Medicine | Admitting: Emergency Medicine

## 2019-04-24 ENCOUNTER — Other Ambulatory Visit: Payer: Self-pay

## 2019-04-24 ENCOUNTER — Encounter: Payer: Self-pay | Admitting: Emergency Medicine

## 2019-04-24 DIAGNOSIS — Z5321 Procedure and treatment not carried out due to patient leaving prior to being seen by health care provider: Secondary | ICD-10-CM | POA: Insufficient documentation

## 2019-04-24 DIAGNOSIS — M549 Dorsalgia, unspecified: Secondary | ICD-10-CM | POA: Diagnosis present

## 2019-04-24 NOTE — ED Triage Notes (Signed)
Pt to triage via w/c with no distress noted, mask in place; pt reports fell coming down steps yesterday hitting rt side back on railing; c/o persistent pain esp with movement

## 2019-04-25 ENCOUNTER — Emergency Department: Payer: Medicare Other

## 2019-04-25 ENCOUNTER — Encounter: Payer: Self-pay | Admitting: Intensive Care

## 2019-04-25 ENCOUNTER — Emergency Department
Admission: EM | Admit: 2019-04-25 | Discharge: 2019-04-25 | Disposition: A | Discharge status: Other Acute Inpatient Hospital | Payer: Medicare Other | Attending: Emergency Medicine | Admitting: Emergency Medicine

## 2019-04-25 ENCOUNTER — Other Ambulatory Visit: Payer: Self-pay

## 2019-04-25 ENCOUNTER — Observation Stay (HOSPITAL_COMMUNITY)
Admission: EM | Admit: 2019-04-25 | Discharge: 2019-04-26 | Disposition: A | Discharge status: Home / Self Care | Payer: Medicare Other | Attending: Emergency Medicine | Admitting: Emergency Medicine

## 2019-04-25 ENCOUNTER — Encounter (HOSPITAL_COMMUNITY): Payer: Self-pay | Admitting: Emergency Medicine

## 2019-04-25 DIAGNOSIS — R2681 Unsteadiness on feet: Secondary | ICD-10-CM | POA: Insufficient documentation

## 2019-04-25 DIAGNOSIS — I498 Other specified cardiac arrhythmias: Secondary | ICD-10-CM | POA: Diagnosis not present

## 2019-04-25 DIAGNOSIS — Z950 Presence of cardiac pacemaker: Secondary | ICD-10-CM | POA: Insufficient documentation

## 2019-04-25 DIAGNOSIS — Z03818 Encounter for observation for suspected exposure to other biological agents ruled out: Secondary | ICD-10-CM | POA: Insufficient documentation

## 2019-04-25 DIAGNOSIS — E119 Type 2 diabetes mellitus without complications: Secondary | ICD-10-CM | POA: Insufficient documentation

## 2019-04-25 DIAGNOSIS — W010XXA Fall on same level from slipping, tripping and stumbling without subsequent striking against object, initial encounter: Secondary | ICD-10-CM | POA: Diagnosis not present

## 2019-04-25 DIAGNOSIS — E1165 Type 2 diabetes mellitus with hyperglycemia: Secondary | ICD-10-CM | POA: Diagnosis not present

## 2019-04-25 DIAGNOSIS — I700 Atherosclerosis of aorta: Secondary | ICD-10-CM | POA: Diagnosis not present

## 2019-04-25 DIAGNOSIS — Z7982 Long term (current) use of aspirin: Secondary | ICD-10-CM | POA: Diagnosis not present

## 2019-04-25 DIAGNOSIS — W108XXA Fall (on) (from) other stairs and steps, initial encounter: Secondary | ICD-10-CM | POA: Diagnosis not present

## 2019-04-25 DIAGNOSIS — Z1159 Encounter for screening for other viral diseases: Secondary | ICD-10-CM | POA: Insufficient documentation

## 2019-04-25 DIAGNOSIS — Z87891 Personal history of nicotine dependence: Secondary | ICD-10-CM | POA: Diagnosis not present

## 2019-04-25 DIAGNOSIS — R1011 Right upper quadrant pain: Secondary | ICD-10-CM | POA: Diagnosis not present

## 2019-04-25 DIAGNOSIS — Y999 Unspecified external cause status: Secondary | ICD-10-CM | POA: Diagnosis not present

## 2019-04-25 DIAGNOSIS — F329 Major depressive disorder, single episode, unspecified: Secondary | ICD-10-CM | POA: Diagnosis not present

## 2019-04-25 DIAGNOSIS — R918 Other nonspecific abnormal finding of lung field: Secondary | ICD-10-CM | POA: Insufficient documentation

## 2019-04-25 DIAGNOSIS — Z794 Long term (current) use of insulin: Secondary | ICD-10-CM | POA: Insufficient documentation

## 2019-04-25 DIAGNOSIS — I10 Essential (primary) hypertension: Secondary | ICD-10-CM | POA: Insufficient documentation

## 2019-04-25 DIAGNOSIS — M1990 Unspecified osteoarthritis, unspecified site: Secondary | ICD-10-CM | POA: Diagnosis not present

## 2019-04-25 DIAGNOSIS — Y9301 Activity, walking, marching and hiking: Secondary | ICD-10-CM | POA: Insufficient documentation

## 2019-04-25 DIAGNOSIS — R52 Pain, unspecified: Secondary | ICD-10-CM | POA: Diagnosis not present

## 2019-04-25 DIAGNOSIS — N433 Hydrocele, unspecified: Secondary | ICD-10-CM | POA: Insufficient documentation

## 2019-04-25 DIAGNOSIS — R0789 Other chest pain: Secondary | ICD-10-CM | POA: Diagnosis not present

## 2019-04-25 DIAGNOSIS — J90 Pleural effusion, not elsewhere classified: Secondary | ICD-10-CM | POA: Insufficient documentation

## 2019-04-25 DIAGNOSIS — S225XXA Flail chest, initial encounter for closed fracture: Secondary | ICD-10-CM | POA: Diagnosis not present

## 2019-04-25 DIAGNOSIS — Z20828 Contact with and (suspected) exposure to other viral communicable diseases: Secondary | ICD-10-CM | POA: Diagnosis not present

## 2019-04-25 DIAGNOSIS — S2241XA Multiple fractures of ribs, right side, initial encounter for closed fracture: Principal | ICD-10-CM

## 2019-04-25 DIAGNOSIS — S20301A Unspecified superficial injuries of right front wall of thorax, initial encounter: Secondary | ICD-10-CM | POA: Diagnosis present

## 2019-04-25 DIAGNOSIS — Z79899 Other long term (current) drug therapy: Secondary | ICD-10-CM | POA: Diagnosis not present

## 2019-04-25 DIAGNOSIS — Y929 Unspecified place or not applicable: Secondary | ICD-10-CM | POA: Insufficient documentation

## 2019-04-25 DIAGNOSIS — K449 Diaphragmatic hernia without obstruction or gangrene: Secondary | ICD-10-CM | POA: Diagnosis not present

## 2019-04-25 DIAGNOSIS — S3991XA Unspecified injury of abdomen, initial encounter: Secondary | ICD-10-CM | POA: Diagnosis not present

## 2019-04-25 DIAGNOSIS — W19XXXA Unspecified fall, initial encounter: Secondary | ICD-10-CM

## 2019-04-25 DIAGNOSIS — S299XXA Unspecified injury of thorax, initial encounter: Secondary | ICD-10-CM | POA: Diagnosis present

## 2019-04-25 DIAGNOSIS — S2249XA Multiple fractures of ribs, unspecified side, initial encounter for closed fracture: Secondary | ICD-10-CM

## 2019-04-25 DIAGNOSIS — S2239XA Fracture of one rib, unspecified side, initial encounter for closed fracture: Secondary | ICD-10-CM

## 2019-04-25 DIAGNOSIS — R1084 Generalized abdominal pain: Secondary | ICD-10-CM | POA: Diagnosis not present

## 2019-04-25 HISTORY — DX: Presence of cardiac pacemaker: Z95.0

## 2019-04-25 HISTORY — DX: Multiple fractures of ribs, unspecified side, initial encounter for closed fracture: S22.49XA

## 2019-04-25 LAB — GLUCOSE, CAPILLARY
Glucose-Capillary: 271 mg/dL — ABNORMAL HIGH (ref 70–99)
Glucose-Capillary: 325 mg/dL — ABNORMAL HIGH (ref 70–99)

## 2019-04-25 LAB — COMPREHENSIVE METABOLIC PANEL
ALT: 29 U/L (ref 0–44)
AST: 25 U/L (ref 15–41)
Albumin: 3.9 g/dL (ref 3.5–5.0)
Alkaline Phosphatase: 68 U/L (ref 38–126)
Anion gap: 10 (ref 5–15)
BUN: 27 mg/dL — ABNORMAL HIGH (ref 8–23)
CO2: 21 mmol/L — ABNORMAL LOW (ref 22–32)
Calcium: 9.4 mg/dL (ref 8.9–10.3)
Chloride: 102 mmol/L (ref 98–111)
Creatinine, Ser: 1.03 mg/dL (ref 0.61–1.24)
GFR calc Af Amer: >60 mL/min (ref >60)
GFR calc non Af Amer: >60 mL/min (ref >60)
Glucose, Bld: 449 mg/dL — ABNORMAL HIGH (ref 70–99)
Potassium: 4.2 mmol/L (ref 3.5–5.1)
Sodium: 133 mmol/L — ABNORMAL LOW (ref 135–145)
Total Bilirubin: 1.3 mg/dL — ABNORMAL HIGH (ref 0.3–1.2)
Total Protein: 6.4 g/dL — ABNORMAL LOW (ref 6.5–8.1)

## 2019-04-25 LAB — CBC WITH DIFFERENTIAL/PLATELET
Abs Immature Granulocytes: 0.02 10*3/uL (ref 0.00–0.07)
Basophils Absolute: 0 10*3/uL (ref 0.0–0.1)
Basophils Relative: 0 %
Eosinophils Absolute: 0.1 10*3/uL (ref 0.0–0.5)
Eosinophils Relative: 1 %
HCT: 42.4 % (ref 39.0–52.0)
Hemoglobin: 13.5 g/dL (ref 13.0–17.0)
Immature Granulocytes: 0 %
Lymphocytes Relative: 13 %
Lymphs Abs: 1.4 10*3/uL (ref 0.7–4.0)
MCH: 26.9 pg (ref 26.0–34.0)
MCHC: 31.8 g/dL (ref 30.0–36.0)
MCV: 84.5 fL (ref 80.0–100.0)
Monocytes Absolute: 0.8 10*3/uL (ref 0.1–1.0)
Monocytes Relative: 8 %
Neutro Abs: 8.6 10*3/uL — ABNORMAL HIGH (ref 1.7–7.7)
Neutrophils Relative %: 78 %
Platelets: 143 10*3/uL — ABNORMAL LOW (ref 150–400)
RBC: 5.02 MIL/uL (ref 4.22–5.81)
RDW: 14.9 % (ref 11.5–15.5)
WBC: 11 10*3/uL — ABNORMAL HIGH (ref 4.0–10.5)
nRBC: 0 % (ref 0.0–0.2)

## 2019-04-25 LAB — CBG MONITORING, ED: Glucose-Capillary: 249 mg/dL — ABNORMAL HIGH (ref 70–99)

## 2019-04-25 LAB — PROTIME-INR
INR: 1 (ref 0.8–1.2)
Prothrombin Time: 13.4 seconds (ref 11.4–15.2)

## 2019-04-25 LAB — HEMOGLOBIN A1C
Hgb A1c MFr Bld: 9.1 % — ABNORMAL HIGH (ref 4.8–5.6)
Mean Plasma Glucose: 214.47 mg/dL

## 2019-04-25 LAB — LIPASE, BLOOD: Lipase: 28 U/L (ref 11–51)

## 2019-04-25 LAB — SARS CORONAVIRUS 2 BY RT PCR (HOSPITAL ORDER, PERFORMED IN ~~LOC~~ HOSPITAL LAB): SARS Coronavirus 2: NEGATIVE

## 2019-04-25 MED ORDER — ONDANSETRON HCL 4 MG/2ML IJ SOLN: 4.0000 mg | Freq: Once | INTRAMUSCULAR | Status: DC

## 2019-04-25 MED ORDER — PANTOPRAZOLE SODIUM 40 MG PO TBEC
40.0000 mg | DELAYED_RELEASE_TABLET | Freq: Every day | ORAL | Status: DC
  Administered 2019-04-26: 40 mg via ORAL
  Filled 2019-04-25: qty 1

## 2019-04-25 MED ORDER — DOCUSATE SODIUM 100 MG PO CAPS
100.0000 mg | ORAL_CAPSULE | Freq: Two times a day (BID) | ORAL | Status: DC
  Administered 2019-04-25 – 2019-04-26 (×2): 100 mg via ORAL
  Filled 2019-04-25 (×2): qty 1

## 2019-04-25 MED ORDER — INSULIN ASPART 100 UNIT/ML ~~LOC~~ SOLN
6.0000 [IU] | Freq: Three times a day (TID) | SUBCUTANEOUS | Status: DC
  Administered 2019-04-25 – 2019-04-26 (×3): 6 [IU] via SUBCUTANEOUS

## 2019-04-25 MED ORDER — INSULIN DETEMIR 100 UNIT/ML ~~LOC~~ SOLN: 20.0000 [IU] | Freq: Every day | SUBCUTANEOUS | Status: DC

## 2019-04-25 MED ORDER — ACETAMINOPHEN 500 MG PO TABS
1000.0000 mg | ORAL_TABLET | Freq: Once | ORAL | Status: AC
  Administered 2019-04-25: 1000 mg via ORAL
  Filled 2019-04-25: qty 2

## 2019-04-25 MED ORDER — INSULIN ASPART 100 UNIT/ML ~~LOC~~ SOLN: 0.0000 [IU] | Freq: Three times a day (TID) | SUBCUTANEOUS | Status: DC

## 2019-04-25 MED ORDER — SODIUM CHLORIDE 0.9 % IV SOLN
INTRAVENOUS | Status: DC
  Administered 2019-04-25 – 2019-04-26 (×2): via INTRAVENOUS

## 2019-04-25 MED ORDER — INSULIN DETEMIR 100 UNIT/ML ~~LOC~~ SOLN
16.0000 [IU] | Freq: Every day | SUBCUTANEOUS | Status: DC
  Administered 2019-04-25: 16 [IU] via SUBCUTANEOUS
  Filled 2019-04-25 (×2): qty 0.16

## 2019-04-25 MED ORDER — INSULIN ASPART 100 UNIT/ML ~~LOC~~ SOLN: 0.0000 [IU] | Freq: Every day | SUBCUTANEOUS | Status: DC

## 2019-04-25 MED ORDER — ONDANSETRON 4 MG PO TBDP: 4.0000 mg | ORAL_TABLET | Freq: Four times a day (QID) | ORAL | Status: DC | PRN

## 2019-04-25 MED ORDER — ATORVASTATIN CALCIUM 10 MG PO TABS
20.0000 mg | ORAL_TABLET | Freq: Every day | ORAL | Status: DC
  Administered 2019-04-25 – 2019-04-26 (×2): 20 mg via ORAL
  Filled 2019-04-25 (×2): qty 2

## 2019-04-25 MED ORDER — ACETAMINOPHEN 325 MG PO TABS
650.0000 mg | ORAL_TABLET | Freq: Four times a day (QID) | ORAL | Status: DC
  Administered 2019-04-25 – 2019-04-26 (×4): 650 mg via ORAL
  Filled 2019-04-25 (×4): qty 2

## 2019-04-25 MED ORDER — TRAMADOL HCL 50 MG PO TABS
50.0000 mg | ORAL_TABLET | Freq: Four times a day (QID) | ORAL | Status: DC | PRN
  Administered 2019-04-26: 50 mg via ORAL
  Filled 2019-04-25: qty 1

## 2019-04-25 MED ORDER — INSULIN ASPART 100 UNIT/ML ~~LOC~~ SOLN
0.0000 [IU] | Freq: Three times a day (TID) | SUBCUTANEOUS | Status: DC
  Administered 2019-04-25: 5 [IU] via SUBCUTANEOUS
  Administered 2019-04-26 (×2): 3 [IU] via SUBCUTANEOUS

## 2019-04-25 MED ORDER — HYDRALAZINE HCL 20 MG/ML IJ SOLN: 5.0000 mg | INTRAMUSCULAR | Status: DC | PRN

## 2019-04-25 MED ORDER — HYDRALAZINE HCL 20 MG/ML IJ SOLN: 10.0000 mg | INTRAMUSCULAR | Status: DC | PRN

## 2019-04-25 MED ORDER — PANTOPRAZOLE SODIUM 40 MG IV SOLR: 40.0000 mg | Freq: Every day | INTRAVENOUS | Status: DC

## 2019-04-25 MED ORDER — IOHEXOL 300 MG/ML  SOLN
100.0000 mL | Freq: Once | INTRAMUSCULAR | Status: AC | PRN
  Administered 2019-04-25: 100 mL via INTRAVENOUS

## 2019-04-25 MED ORDER — ENOXAPARIN SODIUM 40 MG/0.4ML ~~LOC~~ SOLN
40.0000 mg | SUBCUTANEOUS | Status: DC
  Administered 2019-04-25: 40 mg via SUBCUTANEOUS
  Filled 2019-04-25: qty 0.4

## 2019-04-25 MED ORDER — MORPHINE SULFATE (PF) 4 MG/ML IV SOLN: 4.0000 mg | Freq: Once | INTRAVENOUS | Status: DC

## 2019-04-25 MED ORDER — MORPHINE SULFATE (PF) 2 MG/ML IV SOLN: 2.0000 mg | INTRAVENOUS | Status: DC | PRN

## 2019-04-25 MED ORDER — ONDANSETRON HCL 4 MG/2ML IJ SOLN: 4.0000 mg | Freq: Four times a day (QID) | INTRAMUSCULAR | Status: DC | PRN

## 2019-04-25 MED ORDER — INSULIN ASPART 100 UNIT/ML ~~LOC~~ SOLN
0.0000 [IU] | Freq: Every day | SUBCUTANEOUS | Status: DC
  Administered 2019-04-25: 4 [IU] via SUBCUTANEOUS

## 2019-04-25 NOTE — ED Triage Notes (Signed)
Patient fell on Monday. Seen at urgent care today and diagnosed with fractured ribs. Urgent care was concerned patient had flail chest. EMS vitals blood sugar 400, 180/80b/p. Denies hitting head or LOC
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
## 2019-04-25 NOTE — ED Notes (Signed)
Pt ambulatory to toilet with standby assist.  Steady gait noted at this time.

## 2019-04-25 NOTE — ED Notes (Signed)
Pt provided cup of water upon request.

## 2019-04-25 NOTE — ED Notes (Signed)
EMTALA reviewed by charge RN 

## 2019-04-25 NOTE — Care Management (Signed)
RNCM met with patient. He denies needs. He states this is the first time he's fallen; tripped over a garbage bag. He is from home with wife where he is independent. I spoke with EDP regarding incentive spirometer. Patient is eager to discharge.

## 2019-04-25 NOTE — ED Notes (Signed)
ED TO INPATIENT HANDOFF REPORT  ED Nurse Name and Phone #: Kathlee Nations [phone]  S Name/Age/Gender Andrew Wu. 78 y.o. male Room/Bed: 036C/036C  Code Status   Code Status: Full Code  Home/SNF/Other Home Patient oriented to: self, place, time and situation Is this baseline? Yes   Triage Complete: Triage complete  Chief Complaint Flail Chest Trama  Triage Note Pt states he fell down a step and fell onto iron rod. Pt states he heard something "pop" and could hardly breath. Incident occurred 7/20. Pt states he came to the hospital because he was having a harder time moving around.  Pt denies pain during triage.    Allergies Allergies  Allergen Reactions  . Lisinopril Swelling    Facial swelling    . Asa [Aspirin] Other (See Comments)    Stomach hurts - is able to take enteric coated aspirin    Level of Care/Admitting Diagnosis ED Disposition    ED Disposition Condition [place]: [facility] [100100]  Level of Care: Med-Surg [16]  Covid Evaluation: Confirmed COVID Negative  Diagnosis: Rib fractures [573220]  Admitting Physician: TRAUMA MD [place]  Attending Physician: TRAUMA MD [[id number]  Bed request comments: 4NP or 6N  PT Class (Do Not Modify): Observation [104]  PT Acc Code (Do Not Modify): Observation [10022]       B Medical/Surgery History Past Medical History:  Diagnosis Date  . Arthritis   . Cardiac pacemaker in situ   . Depressive disorder, not elsewhere classified   . Sinoatrial node dysfunction (HCC)   . Type II or unspecified type diabetes mellitus without mention of complication, not stated as uncontrolled   . Unspecified essential hypertension   . Unspecified sleep apnea    Past Surgical History:  Procedure Laterality Date  . pacemaker  02/23/08   medtronic Adapta-sinus node dysfunction      A IV Location/Drains/Wounds Patient Lines/Drains/Airways Status   Active Line/Drains/Airways    Name:   Placement  date:   Placement time:   Site:   Days:   Peripheral IV 04/25/19 Right Antecubital   04/25/19    1008    Antecubital   less than 1          Intake/Output Last 24 hours No intake or output data in the 24 hours ending 04/25/19 1645  Labs/Imaging Results for orders placed or performed during the hospital encounter of 04/25/19 (from the past 48 hour(s))  CBG monitoring, ED     Status: Abnormal   Collection Time: 04/25/19  4:37 PM  Result Value Ref Range   Glucose-Capillary 249 (H) 70 - 99 mg/dL   Comment 1 Notify RN    Comment 2 Document in Chart    Ct Chest W Contrast  Result Date: 04/25/2019 CLINICAL DATA:  Fall, trauma, suspect flail chest EXAM: CT CHEST, ABDOMEN, AND PELVIS WITH CONTRAST TECHNIQUE: Multidetector CT imaging of the chest, abdomen and pelvis was performed following the standard protocol during bolus administration of intravenous contrast. CONTRAST:  175mL OMNIPAQUE IOHEXOL 300 MG/ML  SOLN COMPARISON:  08/06/2018 FINDINGS: CT CHEST FINDINGS Cardiovascular: Aortic atherosclerosis. Left chest multi lead pacer. Normal heart size. Three-vessel coronary artery calcifications. No pericardial effusion. Mediastinum/Nodes: No enlarged mediastinal, hilar, or axillary lymph nodes. Small hiatal hernia. Thyroid gland, trachea, and esophagus demonstrate no significant findings. Lungs/Pleura: Mild-to-moderate centrilobular emphysema. Small right pleural effusion associated atelectasis or consolidation. Bandlike scarring or atelectasis of the left lung base. Musculoskeletal: No chest wall mass. There are  multilevel, minimally displaced multisegment fractures of the lateral and posterior right ribs, involving the right sixth through eleventh ribs. CT ABDOMEN PELVIS FINDINGS Hepatobiliary: No solid liver abnormality is seen. No gallstones, gallbladder wall thickening, or biliary dilatation. Pancreas: Unremarkable. No pancreatic ductal dilatation or surrounding inflammatory changes. Spleen: Normal in  size without significant abnormality. Adrenals/Urinary Tract: Unchanged small benign nodule of the left adrenal gland. Kidneys are normal, without renal calculi, solid lesion, or hydronephrosis. Bladder is unremarkable. Stomach/Bowel: Stomach is within normal limits. No evidence of bowel wall thickening, distention, or inflammatory changes. Vascular/Lymphatic: Severe mixed aortic atherosclerosis. No enlarged abdominal or pelvic lymph nodes. Reproductive: Left hydrocele. Brachytherapy pellets of the prostate. Other: No abdominal wall hernia or abnormality. No abdominopelvic ascites. Musculoskeletal: No acute or significant osseous findings. Nonacute pars defects of L5. IMPRESSION: 1. There are multilevel, minimally displaced multisegment fractures of the lateral and posterior right ribs, involving the right sixth through eleventh ribs. There is potential for flail physiology given this configuration of fractures. There is a small associated right pleural effusion without significant pneumothorax. 2. No other evidence of acute traumatic injury to the chest, abdomen, or pelvis. 3.  Chronic and incidental findings as detailed above. Electronically Signed   By: Eddie Candle M.D.   On: 04/25/2019 12:34   Ct Abdomen Pelvis W Contrast  Result Date: 04/25/2019 CLINICAL DATA:  Fall, trauma, suspect flail chest EXAM: CT CHEST, ABDOMEN, AND PELVIS WITH CONTRAST TECHNIQUE: Multidetector CT imaging of the chest, abdomen and pelvis was performed following the standard protocol during bolus administration of intravenous contrast. CONTRAST:  111mL OMNIPAQUE IOHEXOL 300 MG/ML  SOLN COMPARISON:  08/06/2018 FINDINGS: CT CHEST FINDINGS Cardiovascular: Aortic atherosclerosis. Left chest multi lead pacer. Normal heart size. Three-vessel coronary artery calcifications. No pericardial effusion. Mediastinum/Nodes: No enlarged mediastinal, hilar, or axillary lymph nodes. Small hiatal hernia. Thyroid gland, trachea, and esophagus  demonstrate no significant findings. Lungs/Pleura: Mild-to-moderate centrilobular emphysema. Small right pleural effusion associated atelectasis or consolidation. Bandlike scarring or atelectasis of the left lung base. Musculoskeletal: No chest wall mass. There are multilevel, minimally displaced multisegment fractures of the lateral and posterior right ribs, involving the right sixth through eleventh ribs. CT ABDOMEN PELVIS FINDINGS Hepatobiliary: No solid liver abnormality is seen. No gallstones, gallbladder wall thickening, or biliary dilatation. Pancreas: Unremarkable. No pancreatic ductal dilatation or surrounding inflammatory changes. Spleen: Normal in size without significant abnormality. Adrenals/Urinary Tract: Unchanged small benign nodule of the left adrenal gland. Kidneys are normal, without renal calculi, solid lesion, or hydronephrosis. Bladder is unremarkable. Stomach/Bowel: Stomach is within normal limits. No evidence of bowel wall thickening, distention, or inflammatory changes. Vascular/Lymphatic: Severe mixed aortic atherosclerosis. No enlarged abdominal or pelvic lymph nodes. Reproductive: Left hydrocele. Brachytherapy pellets of the prostate. Other: No abdominal wall hernia or abnormality. No abdominopelvic ascites. Musculoskeletal: No acute or significant osseous findings. Nonacute pars defects of L5. IMPRESSION: 1. There are multilevel, minimally displaced multisegment fractures of the lateral and posterior right ribs, involving the right sixth through eleventh ribs. There is potential for flail physiology given this configuration of fractures. There is a small associated right pleural effusion without significant pneumothorax. 2. No other evidence of acute traumatic injury to the chest, abdomen, or pelvis. 3.  Chronic and incidental findings as detailed above. Electronically Signed   By: Eddie Candle M.D.   On: 04/25/2019 12:34    Pending Labs Unresulted Labs (From admission, onward)     Start     Ordered   04/25/19 1632  Hemoglobin A1c  Once,   STAT    Comments: To assess prior glycemic control    04/25/19 1632          Vitals/Pain Today's Vitals   04/25/19 1550 04/25/19 1551 04/25/19 1555  BP:   (!) 155/68  Pulse:   (!) 59  Resp:   16  SpO2: 98%  97%  Weight:  73.9 kg   Height:  5\' 7"  (1.702 m)   PainSc:  0-No pain     Isolation Precautions No active isolations  Medications Medications  atorvastatin (LIPITOR) tablet 20 mg (has no administration in time range)  enoxaparin (LOVENOX) injection 40 mg (has no administration in time range)  0.9 %  sodium chloride infusion (has no administration in time range)  acetaminophen (TYLENOL) tablet 650 mg (has no administration in time range)  morphine 2 MG/ML injection 2 mg (has no administration in time range)  traMADol (ULTRAM) tablet 50-100 mg (has no administration in time range)  docusate sodium (COLACE) capsule 100 mg (has no administration in time range)  ondansetron (ZOFRAN-ODT) disintegrating tablet 4 mg (has no administration in time range)    Or  ondansetron (ZOFRAN) injection 4 mg (has no administration in time range)  pantoprazole (PROTONIX) EC tablet 40 mg (has no administration in time range)    Or  pantoprazole (PROTONIX) injection 40 mg (has no administration in time range)  hydrALAZINE (APRESOLINE) injection 10 mg (has no administration in time range)  insulin aspart (novoLOG) injection 0-15 Units (has no administration in time range)  insulin aspart (novoLOG) injection 0-5 Units (has no administration in time range)  insulin detemir (LEVEMIR) injection 20 Units (has no administration in time range)  insulin aspart (novoLOG) injection 6 Units (has no administration in time range)    Mobility walks Moderate fall risk   Focused Assessments Obs respiratory   R Recommendations: See Admitting Provider Note  Report given to:   Additional Notes:

## 2019-04-25 NOTE — ED Provider Notes (Signed)
Patient sent from [facility] ED after being diagnosed with multiple rib fractures on the right side and small pleural effusion.  Previous provider discussed transfer with trauma attending Dr. Dema Severin.  On patient's arrival to ED he was evaluated the bedside by Dr. Dema Severin and [place] PA-C with admission orders being put in. Pt is stable and resting comfortably on stretcher, in no acute distress.  Vitals:   04/25/19 1615 04/25/19 1630 04/25/19 1645 04/25/19 1700  BP: 139/78 (!) 154/68 (!) 162/70 (!) 157/67  Pulse: (!) 59 62 62 61  Resp: 17 18 19 16   SpO2: 97% 98% 97% 96%  Weight:      Height:           Cherre Robins, PA-C 04/25/19 1709    Charlesetta Shanks, MD 04/26/19 1646

## 2019-04-25 NOTE — ED Provider Notes (Signed)
[facility] Emergency Department Provider Note  ____________________________________________   First MD Initiated Contact with Patient 04/25/19 [phone]     (approximate)  I have reviewed the triage vital signs and the nursing notes.   HISTORY  Chief Complaint Fall    HPI Andrew Zeimet. is a 78 y.o. male with past medical history as below here with right side pain.  The patient states that on Monday, he was taking something outside when he tripped going down stairs.  He fell and landed directly on his right flank, striking a nearby object.  He reports immediate onset of severe right chest wall and flank pain.  Since then, has had persistent pain, limiting his ability to move around.  The pain is severe, aching, intermittently sharp, and worse with movement and deep breathing.  He had presented to the ED but left due to wait times.  He went to urgent care today and was sent here after chest x-ray raise concern for flail chest.  He denies any shortness of breath, though he does have some sensation of limitation of his breathing due to this pain.  No known fevers or chills.  No abdominal pain.  No blood in his stools.  No hematemesis.  No hematuria.  No alleviating factors.        Past Medical History:  Diagnosis Date   Arthritis    Cardiac pacemaker in situ    Depressive disorder, not elsewhere classified    Sinoatrial node dysfunction ([place])    Type II or unspecified type diabetes mellitus without mention of complication, not stated as uncontrolled    Unspecified essential hypertension    Unspecified sleep apnea     Patient Active Problem List   Diagnosis Date Noted   Chest trauma 08/06/2018   DIABETES MELLITUS 03/11/2009   DEPRESSION 03/11/2009   HYPERTENSION, UNSPECIFIED 03/11/2009   SINOATRIAL NODE DYSFUNCTION 03/11/2009   ARTHRITIS 03/11/2009   SLEEP APNEA 03/11/2009   PACEMAKER, PERMANENT 03/11/2009    Past Surgical History:    Procedure Laterality Date   pacemaker  02/23/08   medtronic Adapta-sinus node dysfunction     Prior to Admission medications   Medication Sig Start Date End Date Taking? Authorizing Provider  aspirin EC 81 MG tablet Take 81 mg by mouth daily.   Yes [provider]  atorvastatin (LIPITOR) 20 MG tablet Take 20 mg by mouth daily.   Yes [provider]  COMBIGAN 0.2-0.5 % ophthalmic solution Place 1 drop into both eyes 2 (two) times daily. 02/28/19  Yes [provider]  ezetimibe (ZETIA) 10 MG tablet Take 10 mg by mouth daily. 03/02/19  Yes [provider]  insulin degludec (TRESIBA FLEXTOUCH) 100 UNIT/ML SOPN FlexTouch Pen Inject 16 Units into the skin at bedtime.    Yes [provider]  insulin lispro (HUMALOG) 100 UNIT/ML injection Inject 10 Units into the skin 2 (two) times daily with breakfast and lunch.    Yes [provider]  latanoprost (XALATAN) 0.005 % ophthalmic solution Place 1 drop into both eyes at bedtime. 02/27/19  Yes [provider]    Allergies Lisinopril and Asa [aspirin]  History reviewed. No pertinent family history.  Social History Social History   Tobacco Use   Smoking status: Former Smoker    Types: Cigarettes   Smokeless tobacco: Never Used  Substance Use Topics   Alcohol use: No   Drug use: No    Review of Systems  Review of Systems  Constitutional:  Negative for chills, fatigue and fever.  HENT: Negative for sore throat.   Respiratory: Positive for cough. Negative for shortness of breath.   Cardiovascular: Positive for chest pain.  Gastrointestinal: Negative for abdominal pain.  Genitourinary: Positive for flank pain.  Musculoskeletal: Positive for arthralgias. Negative for neck pain.  Skin: Negative for rash and wound.  Allergic/Immunologic: Negative for immunocompromised state.  Neurological: Negative for weakness and numbness.  Hematological: Does not bruise/bleed easily.  All  other systems reviewed and are negative.    ____________________________________________  PHYSICAL EXAM:      VITAL SIGNS: ED Triage Vitals  Enc Vitals Group     BP 04/25/19 0952 (!) 178/70     Pulse Rate 04/25/19 0952 64     Resp 04/25/19 0952 17     Temp --      Temp Source 04/25/19 0952 Oral     SpO2 04/25/19 0952 97 %     Weight 04/25/19 0955 163 lb (73.9 kg)     Height 04/25/19 0955 5' 7.5" (1.715 m)     Head Circumference --      Peak Flow --      Pain Score 04/25/19 0955 5     Pain Loc --      Pain Edu? --      Excl. in [place]? --      Physical Exam Vitals signs and nursing note reviewed.  Constitutional:      General: He is not in acute distress.    Appearance: He is well-developed.  HENT:     Head: Normocephalic and atraumatic.  Eyes:     Conjunctiva/sclera: Conjunctivae normal.  Neck:     Musculoskeletal: Neck supple.  Cardiovascular:     Rate and Rhythm: Normal rate and regular rhythm.     Heart sounds: Normal heart sounds. No murmur. No friction rub.  Pulmonary:     Effort: Pulmonary effort is normal. No respiratory distress.     Breath sounds: Normal breath sounds. No wheezing or rales.  Chest:     Chest wall: Tenderness present.     Comments: Significant tenderness to palpation over the right posterior lateral chest wall.  No paradoxical motion noted.  No bruising. Abdominal:     General: There is no distension.     Palpations: Abdomen is soft.     Tenderness: There is no abdominal tenderness.     Comments: Mild referred pain to the right upper chest wall with palpation of the abdomen  Skin:    General: Skin is warm.     Capillary Refill: Capillary refill takes less than 2 seconds.  Neurological:     Mental Status: He is alert and oriented to person, place, and time.     Motor: No abnormal muscle tone.       ____________________________________________   LABS (all labs ordered are listed, but only abnormal results are displayed)  Labs  Reviewed  CBC WITH DIFFERENTIAL/PLATELET - Abnormal; Notable for the following components:      Result Value   WBC 11.0 (*)    Platelets 143 (*)    Neutro Abs 8.6 (*)    All other components within normal limits  COMPREHENSIVE METABOLIC PANEL - Abnormal; Notable for the following components:   Sodium 133 (*)    CO2 21 (*)    Glucose, Bld 449 (*)    BUN 27 (*)    Total Protein 6.4 (*)    Total Bilirubin 1.3 (*)    All other  components within normal limits  SARS CORONAVIRUS 2 (HOSPITAL ORDER, [place] [facility])  LIPASE, BLOOD  PROTIME-INR  URINALYSIS, COMPLETE (UACMP) WITH MICROSCOPIC    ____________________________________________  EKG: Atrial paced rhythm, ventricular rate 64.  QRS 154, QTc 455.  No apparent acute ischemic changes. ________________________________________  RADIOLOGY All imaging, including plain films, CT scans, and ultrasounds, independently reviewed by me, and interpretations confirmed via formal radiology reads.  ED MD interpretation:   CT chest: Multisegment fractures of right 6 through 11th ribs, small effusion, no pneumo  CT abdomen/pelvis: Negative  Official radiology report(s): Ct Chest W Contrast  Result Date: 04/25/2019 CLINICAL DATA:  Fall, trauma, suspect flail chest EXAM: CT CHEST, ABDOMEN, AND PELVIS WITH CONTRAST TECHNIQUE: Multidetector CT imaging of the chest, abdomen and pelvis was performed following the standard protocol during bolus administration of intravenous contrast. CONTRAST:  173mL OMNIPAQUE IOHEXOL 300 MG/ML  SOLN COMPARISON:  08/06/2018 FINDINGS: CT CHEST FINDINGS Cardiovascular: Aortic atherosclerosis. Left chest multi lead pacer. Normal heart size. Three-vessel coronary artery calcifications. No pericardial effusion. Mediastinum/Nodes: No enlarged mediastinal, hilar, or axillary lymph nodes. Small hiatal hernia. Thyroid gland, trachea, and esophagus demonstrate no significant findings. Lungs/Pleura:  Mild-to-moderate centrilobular emphysema. Small right pleural effusion associated atelectasis or consolidation. Bandlike scarring or atelectasis of the left lung base. Musculoskeletal: No chest wall mass. There are multilevel, minimally displaced multisegment fractures of the lateral and posterior right ribs, involving the right sixth through eleventh ribs. CT ABDOMEN PELVIS FINDINGS Hepatobiliary: No solid liver abnormality is seen. No gallstones, gallbladder wall thickening, or biliary dilatation. Pancreas: Unremarkable. No pancreatic ductal dilatation or surrounding inflammatory changes. Spleen: Normal in size without significant abnormality. Adrenals/Urinary Tract: Unchanged small benign nodule of the left adrenal gland. Kidneys are normal, without renal calculi, solid lesion, or hydronephrosis. Bladder is unremarkable. Stomach/Bowel: Stomach is within normal limits. No evidence of bowel wall thickening, distention, or inflammatory changes. Vascular/Lymphatic: Severe mixed aortic atherosclerosis. No enlarged abdominal or pelvic lymph nodes. Reproductive: Left hydrocele. Brachytherapy pellets of the prostate. Other: No abdominal wall hernia or abnormality. No abdominopelvic ascites. Musculoskeletal: No acute or significant osseous findings. Nonacute pars defects of L5. IMPRESSION: 1. There are multilevel, minimally displaced multisegment fractures of the lateral and posterior right ribs, involving the right sixth through eleventh ribs. There is potential for flail physiology given this configuration of fractures. There is a small associated right pleural effusion without significant pneumothorax. 2. No other evidence of acute traumatic injury to the chest, abdomen, or pelvis. 3.  Chronic and incidental findings as detailed above. Electronically Signed   By: Eddie Candle M.D.   On: 04/25/2019 12:34   Ct Abdomen Pelvis W Contrast  Result Date: 04/25/2019 CLINICAL DATA:  Fall, trauma, suspect flail chest EXAM:  CT CHEST, ABDOMEN, AND PELVIS WITH CONTRAST TECHNIQUE: Multidetector CT imaging of the chest, abdomen and pelvis was performed following the standard protocol during bolus administration of intravenous contrast. CONTRAST:  172mL OMNIPAQUE IOHEXOL 300 MG/ML  SOLN COMPARISON:  08/06/2018 FINDINGS: CT CHEST FINDINGS Cardiovascular: Aortic atherosclerosis. Left chest multi lead pacer. Normal heart size. Three-vessel coronary artery calcifications. No pericardial effusion. Mediastinum/Nodes: No enlarged mediastinal, hilar, or axillary lymph nodes. Small hiatal hernia. Thyroid gland, trachea, and esophagus demonstrate no significant findings. Lungs/Pleura: Mild-to-moderate centrilobular emphysema. Small right pleural effusion associated atelectasis or consolidation. Bandlike scarring or atelectasis of the left lung base. Musculoskeletal: No chest wall mass. There are multilevel, minimally displaced multisegment fractures of the lateral and posterior right ribs, involving the right sixth through  eleventh ribs. CT ABDOMEN PELVIS FINDINGS Hepatobiliary: No solid liver abnormality is seen. No gallstones, gallbladder wall thickening, or biliary dilatation. Pancreas: Unremarkable. No pancreatic ductal dilatation or surrounding inflammatory changes. Spleen: Normal in size without significant abnormality. Adrenals/Urinary Tract: Unchanged small benign nodule of the left adrenal gland. Kidneys are normal, without renal calculi, solid lesion, or hydronephrosis. Bladder is unremarkable. Stomach/Bowel: Stomach is within normal limits. No evidence of bowel wall thickening, distention, or inflammatory changes. Vascular/Lymphatic: Severe mixed aortic atherosclerosis. No enlarged abdominal or pelvic lymph nodes. Reproductive: Left hydrocele. Brachytherapy pellets of the prostate. Other: No abdominal wall hernia or abnormality. No abdominopelvic ascites. Musculoskeletal: No acute or significant osseous findings. Nonacute pars defects of  L5. IMPRESSION: 1. There are multilevel, minimally displaced multisegment fractures of the lateral and posterior right ribs, involving the right sixth through eleventh ribs. There is potential for flail physiology given this configuration of fractures. There is a small associated right pleural effusion without significant pneumothorax. 2. No other evidence of acute traumatic injury to the chest, abdomen, or pelvis. 3.  Chronic and incidental findings as detailed above. Electronically Signed   By: Eddie Candle M.D.   On: 04/25/2019 12:34    ____________________________________________  PROCEDURES   Procedure(s) performed (including Critical Care):  Procedures  ____________________________________________  INITIAL IMPRESSION / MDM / [place] / ED COURSE  As part of my medical decision making, I reviewed the following data within the electronic MEDICAL RECORD NUMBER Notes from prior ED visits and [place] Controlled Substance Database      *Andrew Chaires. was evaluated in Emergency Department on 04/25/2019 for the symptoms described in the history of present illness. He was evaluated in the context of the global COVID-19 pandemic, which necessitated consideration that the patient might be at risk for infection with the SARS-CoV-2 virus that causes COVID-19. Institutional protocols and algorithms that pertain to the evaluation of patients at risk for COVID-19 are in a state of rapid change based on information released by regulatory bodies including the CDC and federal and state organizations. These policies and algorithms were followed during the patient's care in the ED.  Some ED evaluations and interventions may be delayed as a result of limited staffing during the pandemic.*      Medical Decision Making: 78 year old, well-appearing male here with right sided chest wall pain after fall on Monday.  I reviewed the plain films from urgent care which showed multiple segmented rib fractures of the  right.  Given the location, CT chest and abdomen/pelvis obtained.  CT scan show minimally displaced, multisegment fractures of 6 through 11th ribs.  There is no pneumothorax or hemothorax.  Small pleural effusion noted.  No other injuries to the liver, kidney, or abdomen.  Discussed case with our surgeon here, who recommends transfer to trauma center.  Dr. Dema Severin of trauma team at [facility] has accepted.  Will be transferred ED to ED.  Dr. Sherry Ruffing of the ED aware.  Patient also aware and in agreement with plan.  Tylenol and low-dose morphine given.  ____________________________________________  FINAL CLINICAL IMPRESSION(S) / ED DIAGNOSES  Final diagnoses:  Closed fracture of multiple ribs of right side, initial encounter  Fall, initial encounter     MEDICATIONS GIVEN DURING THIS VISIT:  Medications  morphine 4 MG/ML injection 4 mg (0 mg Intravenous Hold 04/25/19 1024)  ondansetron (ZOFRAN) injection 4 mg (0 mg Intravenous Hold 04/25/19 1024)  acetaminophen (TYLENOL) tablet 1,000 mg (has no administration in time range)  iohexol (OMNIPAQUE)  300 MG/ML solution 100 mL (100 mLs Intravenous Contrast Given 04/25/19 1149)     ED Discharge Orders    None       Note:  This document was prepared using Dragon voice recognition software and may include unintentional dictation errors.   Duffy Bruce, MD 04/25/19 1346

## 2019-04-25 NOTE — ED Notes (Signed)
Patient transported to CT 

## 2019-04-25 NOTE — ED Notes (Signed)
Pt tripped and fell Monday and has had right rib pain since. Sent from urgent care for flail chest based on xray results. No paradoxical breathing or sucking chest wound noted on physical exam at this time.  Pt denies wanting pain medication at this time. No cough or fevers.  No respiratory distress

## 2019-04-25 NOTE — ED Notes (Signed)
EDP to bedside. 

## 2019-04-25 NOTE — ED Triage Notes (Signed)
Pt states he fell down a step and fell onto [place]. Pt states he heard something "pop" and could hardly breath. Incident occurred 7/20. Pt states he came to the hospital because he was having a harder time moving around.  Pt denies pain during triage.

## 2019-04-25 NOTE — Progress Notes (Signed)
Arrived to 6n19 at this time from ED

## 2019-04-25 NOTE — ED Notes (Signed)
Pt did not want pain meds at this time. Aware to let RN know if changes mind.

## 2019-04-25 NOTE — H&P (Signed)
[facility] Surgery Admission Note  Andrew Wu. 1941-03-30  [id number].    Requesting MD: Ellender Hose Chief Complaint/Reason for Consult: rib fractures HPI:  Patient is a 78 year old male with PMH of HTN, T2DM, and pacemaker who presented to [facility] today with complaint of R sided chest pain after fall around 5 PM Monday. Patient reports he was taking the trash out and tripped over trash bag landing on R ribs. Patient denies hitting head or LOC. He heard some cracking and popping and had some pain but tried to manage pain at home. Since the fall his pain has been mild at rest but is significantly worse with movement. Patient denies recent fever, chills, cough, SOB, palpitations, abdominal pain, n/v, diarrhea, constipation, numbness or tingling. Patient reports blood glucose has been well controlled until recently, did not take insulin today because he had not eaten. Allergic to lisinopril and uncoated ASA. No blood thinning medications. Patient denies alcohol, tobacco or illicit drug use. Patient is retired and lives at home with his wife.   ROS: Review of Systems  Constitutional: Negative for chills and fever.  Respiratory: Negative for cough and shortness of breath.   Cardiovascular: Positive for chest pain. Negative for palpitations.  Gastrointestinal: Negative for abdominal pain, constipation, diarrhea, nausea and vomiting.  Genitourinary: Negative for dysuria, frequency and urgency.  Musculoskeletal: Negative for back pain, joint pain and neck pain.  Neurological: Negative for tingling, loss of consciousness and headaches.    No family history on file.  Past Medical History:  Diagnosis Date  . Arthritis   . Cardiac pacemaker in situ   . Depressive disorder, not elsewhere classified   . Sinoatrial node dysfunction (HCC)   . Type II or unspecified type diabetes mellitus without mention of complication, not stated as uncontrolled   . Unspecified essential hypertension   . Unspecified  sleep apnea     Past Surgical History:  Procedure Laterality Date  . pacemaker  02/23/08   medtronic Adapta-sinus node dysfunction     Social History:  reports that he has quit smoking. His smoking use included cigarettes. He has never used smokeless tobacco. He reports that he does not drink alcohol or use drugs.  Allergies:  Allergies  Allergen Reactions  . Lisinopril Swelling    Facial swelling    . Asa [Aspirin] Other (See Comments)    Stomach hurts - is able to take enteric coated aspirin    (Not in a hospital admission)   Blood pressure (!) 155/68, pulse (!) 59, resp. rate 16, height 5\' 7"  (1.702 m), weight 73.9 kg, SpO2 97 %. Physical Exam: Physical Exam Constitutional:      General: He is not in acute distress.    Appearance: He is well-developed and normal weight. He is not toxic-appearing.  HENT:     Head: Normocephalic and atraumatic. No raccoon eyes or Battle's sign.     Right Ear: External ear normal.     Left Ear: External ear normal.     Nose: Nose normal.     Mouth/Throat:     Mouth: Mucous membranes are moist.  Eyes:     General: Lids are normal. No scleral icterus.    Extraocular Movements: Extraocular movements intact.     Conjunctiva/sclera: Conjunctivae normal.  Neck:     Musculoskeletal: Normal range of motion and neck supple.  Cardiovascular:     Rate and Rhythm: Normal rate and regular rhythm.     Pulses:  Radial pulses are 2+ on the right side and 2+ on the left side.       Dorsalis pedis pulses are 2+ on the right side and 2+ on the left side.     Heart sounds: No murmur. No friction rub. No gallop.   Pulmonary:     Effort: Pulmonary effort is normal.     Breath sounds: Normal breath sounds. No decreased breath sounds, wheezing, rhonchi or rales.  Chest:     Chest wall: Tenderness present. No lacerations or crepitus.  Abdominal:     General: Bowel sounds are normal. There is no distension.     Palpations: Abdomen is soft. There  is no hepatomegaly or splenomegaly.     Tenderness: There is no abdominal tenderness. There is no guarding or rebound.  Musculoskeletal:     Right lower leg: No edema.     Left lower leg: No edema.     Comments: ROM grossly intact in bilateral upper and lower extremities  Skin:    General: Skin is warm and dry.  Neurological:     Mental Status: He is alert and oriented to person, place, and time.     GCS: GCS eye subscore is 4. GCS verbal subscore is 5. GCS motor subscore is 6.     Motor: Motor function is intact.  Psychiatric:        Attention and Perception: Attention and perception normal.        Mood and Affect: Mood and affect normal.        Speech: Speech normal.        Behavior: Behavior normal. Behavior is cooperative.     Results for orders placed or performed during the hospital encounter of 04/25/19 (from the past 48 hour(s))  CBC with Differential     Status: Abnormal   Collection Time: 04/25/19 10:04 AM  Result Value Ref Range   WBC 11.0 (H) 4.0 - 10.5 K/uL   RBC 5.02 4.22 - 5.81 MIL/uL   Hemoglobin 13.5 13.0 - 17.0 g/dL   HCT 42.4 39.0 - 52.0 %   MCV 84.5 80.0 - 100.0 fL   MCH 26.9 26.0 - 34.0 pg   MCHC 31.8 30.0 - 36.0 g/dL   RDW 14.9 11.5 - 15.5 %   Platelets 143 (L) 150 - 400 K/uL   nRBC 0.0 0.0 - 0.2 %   Neutrophils Relative % 78 %   Neutro Abs 8.6 (H) 1.7 - 7.7 K/uL   Lymphocytes Relative 13 %   Lymphs Abs 1.4 0.7 - 4.0 K/uL   Monocytes Relative 8 %   Monocytes Absolute 0.8 0.1 - 1.0 K/uL   Eosinophils Relative 1 %   Eosinophils Absolute 0.1 0.0 - 0.5 K/uL   Basophils Relative 0 %   Basophils Absolute 0.0 0.0 - 0.1 K/uL   Immature Granulocytes 0 %   Abs Immature Granulocytes 0.02 0.00 - 0.07 K/uL    Comment: Performed at [facility], [address] [place], [place] [address]  Comprehensive metabolic panel     Status: Abnormal   Collection Time: 04/25/19 10:04 AM  Result Value Ref Range   Sodium 133 (L) 135 - 145 mmol/L   Potassium 4.2  3.5 - 5.1 mmol/L   Chloride 102 98 - 111 mmol/L   CO2 21 (L) 22 - 32 mmol/L   Glucose, Bld 449 (H) 70 - 99 mg/dL   BUN 27 (H) 8 - 23 mg/dL   Creatinine, Ser 1.03 0.61 - 1.24  mg/dL   Calcium 9.4 8.9 - 10.3 mg/dL   Total Protein 6.4 (L) 6.5 - 8.1 g/dL   Albumin 3.9 3.5 - 5.0 g/dL   AST 25 15 - 41 U/L   ALT 29 0 - 44 U/L   Alkaline Phosphatase 68 38 - 126 U/L   Total Bilirubin 1.3 (H) 0.3 - 1.2 mg/dL   GFR calc non Af Amer >60 >60 mL/min   GFR calc Af Amer >60 >60 mL/min   Anion gap 10 5 - 15    Comment: Performed at [facility], [address] [place], [place] [address]  Lipase, blood     Status: None   Collection Time: 04/25/19 10:04 AM  Result Value Ref Range   Lipase 28 11 - 51 U/L    Comment: Performed at [facility], [address] [place], [place] [address]  Protime-INR     Status: None   Collection Time: 04/25/19 10:04 AM  Result Value Ref Range   Prothrombin Time 13.4 11.4 - 15.2 seconds   INR 1.0 0.8 - 1.2    Comment: (NOTE) INR goal varies based on device and disease states. Performed at [facility], [address] [place], [place] [address]   SARS Coronavirus 2 (CEPHEID - Performed in [facility] hospital lab), Hosp Order     Status: None   Collection Time: 04/25/19  2:20 PM   Specimen: Nasopharyngeal Swab  Result Value Ref Range   SARS Coronavirus 2 NEGATIVE NEGATIVE    Comment: (NOTE) If result is NEGATIVE SARS-CoV-2 target nucleic acids are NOT DETECTED. The SARS-CoV-2 RNA is generally detectable in upper and lower  respiratory specimens during the acute phase of infection. The lowest  concentration of SARS-CoV-2 viral copies this assay can detect is 250  copies / mL. A negative result does not preclude SARS-CoV-2 infection  and should not be used as the sole basis for treatment or other  patient management decisions.  A negative result may occur with  improper specimen collection / handling, submission of specimen other   than nasopharyngeal swab, presence of viral mutation(s) within the  areas targeted by this assay, and inadequate number of viral copies  (<250 copies / mL). A negative result must be combined with clinical  observations, patient history, and epidemiological information. If result is POSITIVE SARS-CoV-2 target nucleic acids are DETECTED. The SARS-CoV-2 RNA is generally detectable in upper and lower  respiratory specimens dur ing the acute phase of infection.  Positive  results are indicative of active infection with SARS-CoV-2.  Clinical  correlation with patient history and other diagnostic information is  necessary to determine patient infection status.  Positive results do  not rule out bacterial infection or co-infection with other viruses. If result is PRESUMPTIVE POSTIVE SARS-CoV-2 nucleic acids MAY BE PRESENT.   A presumptive positive result was obtained on the submitted specimen  and confirmed on repeat testing.  While 2019 novel coronavirus  (SARS-CoV-2) nucleic acids may be present in the submitted sample  additional confirmatory testing may be necessary for epidemiological  and / or clinical management purposes  to differentiate between  SARS-CoV-2 and other Sarbecovirus currently known to infect humans.  If clinically indicated additional testing with an alternate test  methodology [phone]) is advised. The SARS-CoV-2 RNA is generally  detectable in upper and lower respiratory sp ecimens during the acute  phase of infection. The expected result is Negative. Fact Sheet for Patients:  StrictlyIdeas.no Fact Sheet for Healthcare Providers: BankingDealers.co.za  This test is not yet approved or cleared by the [place] and has been authorized for detection and/or diagnosis of SARS-CoV-2 by FDA under an Emergency Use Authorization (EUA).  This EUA will remain in effect (meaning this test can be used) for the duration of  the COVID-19 declaration under Section 564(b)(1) of the Act, 21 U.S.C. section 360bbb-3(b)(1), unless the authorization is terminated or revoked sooner. Performed at [facility], [address] [place], [place] [address]   Glucose, capillary     Status: Abnormal   Collection Time: 04/25/19  2:55 PM  Result Value Ref Range   Glucose-Capillary 271 (H) 70 - 99 mg/dL   Ct Chest W Contrast  Result Date: 04/25/2019 CLINICAL DATA:  Fall, trauma, suspect flail chest EXAM: CT CHEST, ABDOMEN, AND PELVIS WITH CONTRAST TECHNIQUE: Multidetector CT imaging of the chest, abdomen and pelvis was performed following the standard protocol during bolus administration of intravenous contrast. CONTRAST:  134mL OMNIPAQUE IOHEXOL 300 MG/ML  SOLN COMPARISON:  08/06/2018 FINDINGS: CT CHEST FINDINGS Cardiovascular: Aortic atherosclerosis. Left chest multi lead pacer. Normal heart size. Three-vessel coronary artery calcifications. No pericardial effusion. Mediastinum/Nodes: No enlarged mediastinal, hilar, or axillary lymph nodes. Small hiatal hernia. Thyroid gland, trachea, and esophagus demonstrate no significant findings. Lungs/Pleura: Mild-to-moderate centrilobular emphysema. Small right pleural effusion associated atelectasis or consolidation. Bandlike scarring or atelectasis of the left lung base. Musculoskeletal: No chest wall mass. There are multilevel, minimally displaced multisegment fractures of the lateral and posterior right ribs, involving the right sixth through eleventh ribs. CT ABDOMEN PELVIS FINDINGS Hepatobiliary: No solid liver abnormality is seen. No gallstones, gallbladder wall thickening, or biliary dilatation. Pancreas: Unremarkable. No pancreatic ductal dilatation or surrounding inflammatory changes. Spleen: Normal in size without significant abnormality. Adrenals/Urinary Tract: Unchanged small benign nodule of the left adrenal gland. Kidneys are normal, without renal calculi, solid lesion, or  hydronephrosis. Bladder is unremarkable. Stomach/Bowel: Stomach is within normal limits. No evidence of bowel wall thickening, distention, or inflammatory changes. Vascular/Lymphatic: Severe mixed aortic atherosclerosis. No enlarged abdominal or pelvic lymph nodes. Reproductive: Left hydrocele. Brachytherapy pellets of the prostate. Other: No abdominal wall hernia or abnormality. No abdominopelvic ascites. Musculoskeletal: No acute or significant osseous findings. Nonacute pars defects of L5. IMPRESSION: 1. There are multilevel, minimally displaced multisegment fractures of the lateral and posterior right ribs, involving the right sixth through eleventh ribs. There is potential for flail physiology given this configuration of fractures. There is a small associated right pleural effusion without significant pneumothorax. 2. No other evidence of acute traumatic injury to the chest, abdomen, or pelvis. 3.  Chronic and incidental findings as detailed above. Electronically Signed   By: Eddie Candle M.D.   On: 04/25/2019 12:34   Ct Abdomen Pelvis W Contrast  Result Date: 04/25/2019 CLINICAL DATA:  Fall, trauma, suspect flail chest EXAM: CT CHEST, ABDOMEN, AND PELVIS WITH CONTRAST TECHNIQUE: Multidetector CT imaging of the chest, abdomen and pelvis was performed following the standard protocol during bolus administration of intravenous contrast. CONTRAST:  180mL OMNIPAQUE IOHEXOL 300 MG/ML  SOLN COMPARISON:  08/06/2018 FINDINGS: CT CHEST FINDINGS Cardiovascular: Aortic atherosclerosis. Left chest multi lead pacer. Normal heart size. Three-vessel coronary artery calcifications. No pericardial effusion. Mediastinum/Nodes: No enlarged mediastinal, hilar, or axillary lymph nodes. Small hiatal hernia. Thyroid gland, trachea, and esophagus demonstrate no significant findings. Lungs/Pleura: Mild-to-moderate centrilobular emphysema. Small right pleural effusion associated atelectasis or consolidation. Bandlike scarring or  atelectasis of the left lung base. Musculoskeletal: No chest wall mass. There are multilevel, minimally  displaced multisegment fractures of the lateral and posterior right ribs, involving the right sixth through eleventh ribs. CT ABDOMEN PELVIS FINDINGS Hepatobiliary: No solid liver abnormality is seen. No gallstones, gallbladder wall thickening, or biliary dilatation. Pancreas: Unremarkable. No pancreatic ductal dilatation or surrounding inflammatory changes. Spleen: Normal in size without significant abnormality. Adrenals/Urinary Tract: Unchanged small benign nodule of the left adrenal gland. Kidneys are normal, without renal calculi, solid lesion, or hydronephrosis. Bladder is unremarkable. Stomach/Bowel: Stomach is within normal limits. No evidence of bowel wall thickening, distention, or inflammatory changes. Vascular/Lymphatic: Severe mixed aortic atherosclerosis. No enlarged abdominal or pelvic lymph nodes. Reproductive: Left hydrocele. Brachytherapy pellets of the prostate. Other: No abdominal wall hernia or abnormality. No abdominopelvic ascites. Musculoskeletal: No acute or significant osseous findings. Nonacute pars defects of L5. IMPRESSION: 1. There are multilevel, minimally displaced multisegment fractures of the lateral and posterior right ribs, involving the right sixth through eleventh ribs. There is potential for flail physiology given this configuration of fractures. There is a small associated right pleural effusion without significant pneumothorax. 2. No other evidence of acute traumatic injury to the chest, abdomen, or pelvis. 3.  Chronic and incidental findings as detailed above. Electronically Signed   By: Eddie Candle M.D.   On: 04/25/2019 12:34      Assessment/Plan Ground Level Fall Right 6-11th rib fractures, some segmental - multimodal pain control, IS, pulm toilet - repeat CXR tomorrow  - PT/OT T2DM - SSI HTN - prn hydralazine Hx of cardiac pacemaker - cardiac monitor  FEN:  CM diet, IVF VTE: SCDs, lovenox ID: no abx indicated   Dispo: admit to trauma, medicine consult for hyperglycemia in T2DM. Pain control, PT/OT, repeat CXR in AM.   Brigid Re, [facility] Surgery 04/25/2019, 4:14 PM Pager: [phone]

## 2019-04-26 ENCOUNTER — Observation Stay (HOSPITAL_COMMUNITY): Payer: Medicare Other

## 2019-04-26 DIAGNOSIS — S2241XA Multiple fractures of ribs, right side, initial encounter for closed fracture: Secondary | ICD-10-CM | POA: Diagnosis not present

## 2019-04-26 DIAGNOSIS — J9811 Atelectasis: Secondary | ICD-10-CM | POA: Diagnosis not present

## 2019-04-26 DIAGNOSIS — I10 Essential (primary) hypertension: Secondary | ICD-10-CM | POA: Diagnosis not present

## 2019-04-26 DIAGNOSIS — E119 Type 2 diabetes mellitus without complications: Secondary | ICD-10-CM

## 2019-04-26 DIAGNOSIS — Z794 Long term (current) use of insulin: Secondary | ICD-10-CM | POA: Diagnosis not present

## 2019-04-26 LAB — CBC
HCT: 41.1 % (ref 39.0–52.0)
Hemoglobin: 13.2 g/dL (ref 13.0–17.0)
MCH: 27 pg (ref 26.0–34.0)
MCHC: 32.1 g/dL (ref 30.0–36.0)
MCV: 84.2 fL (ref 80.0–100.0)
Platelets: 147 10*3/uL — ABNORMAL LOW (ref 150–400)
RBC: 4.88 MIL/uL (ref 4.22–5.81)
RDW: 14.7 % (ref 11.5–15.5)
WBC: 8.6 10*3/uL (ref 4.0–10.5)
nRBC: 0 % (ref 0.0–0.2)

## 2019-04-26 LAB — BASIC METABOLIC PANEL
Anion gap: 10 (ref 5–15)
BUN: 20 mg/dL (ref 8–23)
CO2: 23 mmol/L (ref 22–32)
Calcium: 9.6 mg/dL (ref 8.9–10.3)
Chloride: 106 mmol/L (ref 98–111)
Creatinine, Ser: 1.01 mg/dL (ref 0.61–1.24)
GFR calc Af Amer: >60 mL/min (ref >60)
GFR calc non Af Amer: >60 mL/min (ref >60)
Glucose, Bld: 213 mg/dL — ABNORMAL HIGH (ref 70–99)
Potassium: 4.2 mmol/L (ref 3.5–5.1)
Sodium: 139 mmol/L (ref 135–145)

## 2019-04-26 LAB — GLUCOSE, CAPILLARY
Glucose-Capillary: 179 mg/dL — ABNORMAL HIGH (ref 70–99)
Glucose-Capillary: 194 mg/dL — ABNORMAL HIGH (ref 70–99)
Glucose-Capillary: 215 mg/dL — ABNORMAL HIGH (ref 70–99)
Glucose-Capillary: 277 mg/dL — ABNORMAL HIGH (ref 70–99)

## 2019-04-26 MED ORDER — METHOCARBAMOL 500 MG PO TABS: 500.0000 mg | ORAL_TABLET | Freq: Three times a day (TID) | ORAL | 0 refills | Status: DC

## 2019-04-26 MED ORDER — TRAMADOL HCL 50 MG PO TABS: 50.0000 mg | ORAL_TABLET | Freq: Four times a day (QID) | ORAL | 1 refills | Status: DC | PRN

## 2019-04-26 MED ORDER — ACETAMINOPHEN 325 MG PO TABS: 650.0000 mg | ORAL_TABLET | Freq: Four times a day (QID) | ORAL | Status: DC | PRN

## 2019-04-26 MED ORDER — METHOCARBAMOL 500 MG PO TABS
500.0000 mg | ORAL_TABLET | Freq: Three times a day (TID) | ORAL | Status: DC
  Administered 2019-04-26: 500 mg via ORAL
  Filled 2019-04-26: qty 1

## 2019-04-26 NOTE — Progress Notes (Signed)
    Reason for consult/CC: Diabetes mellitus/hyperglycemia  Patient's chart reviewed. Patient interviewed and evaluated at bedside. Patient with no concerns today; hoping to be discharged.  Vitals:   04/25/19 2125 04/26/19 0525  BP: (!) 145/72 (!) 142/68  Pulse: 68 71  Resp: 18 17  Temp: 98.6 F (37 C) 98.5 F (36.9 C)  SpO2: 96% 97%   General: appears well. No distress   Assessment/Discharge recommendations:  Diabetes mellitus, insulin dependent -Continue home Tresiba 16 units qhs (first dose tonight) -Continue home Humalog 10 units qAC daily (first dose depending on last dose received in the hospital)  Hyperlipidemia -Continue home Lipitor 20 mg qhs and Zetia 10 mg daily  If patient does not discharge today, would continue current dosing for insulin. Will sign off at this time. Please call back with further questions. Thanks!  Cordelia Poche, MD [facility] 04/26/2019, 2:11 PM

## 2019-04-26 NOTE — Discharge Instructions (Signed)
Rib Fracture  A rib fracture is a break or crack in one of the bones of the ribs. The ribs are like a cage that goes around your upper chest. A broken or cracked rib is often painful, but most do not cause other problems. Most rib fractures usually heal on their own in 1-3 months. Follow these instructions at home: Managing pain, stiffness, and swelling  If directed, apply ice to the injured area. ? Put ice in a plastic bag. ? Place a towel between your skin and the bag. ? Leave the ice on for 20 minutes, 2-3 times a day.  Take over-the-counter and prescription medicines only as told by your doctor. Activity  Avoid activities that cause pain to the injured area. Protect your injured area.  Slowly increase activity as told by your doctor. General instructions  Do deep breathing as told by your doctor. You may be told to: ? Take deep breaths many times a day. ? Cough many times a day while hugging a pillow. ? Use a device (incentive spirometer) to do deep breathing many times a day.  Drink enough fluid to keep your pee (urine) clear or pale yellow.  Do not wear a rib belt or binder. These do not allow you to breathe deeply.  Keep all follow-up visits as told by your doctor. This is important. Contact a doctor if:  You have a fever. Get help right away if:  You have trouble breathing.  You are short of breath.  You cannot stop coughing.  You cough up thick or bloody spit (sputum).  You feel sick to your stomach (nauseous), throw up (vomit), or have belly (abdominal) pain.  Your pain gets worse and medicine does not help. Summary  A rib fracture is a break or crack in one of the bones of the ribs.  Apply ice to the injured area and take medicines for pain as told by your doctor.  Take deep breaths and cough many times a day. Hug a pillow every time you cough. This information is not intended to replace advice given to you by your health care provider. Make sure you  discuss any questions you have with your health care provider. Document Released: 06/29/2008 Document Revised: 09/02/2017 Document Reviewed: 12/21/2016 Elsevier Patient Education  2020 [employer].

## 2019-04-26 NOTE — Evaluation (Signed)
Occupational Therapy Evaluation Patient Details Name: Andrew Wu. MRN: [id number] DOB: 06-18-1941 Today's Date: 04/26/2019    History of Present Illness  78 yo male with onset of chest trauma from a fall at ground level was admitted with R 6-11 rib fractures, home with family to assist. PMHx:  HTN, DM, pacemaker, sleep apnea, sinoatrial node dysfunction,    Clinical Impression    Pt with minimal pain this session and performing all mobility and self care tasks with supervision overall without use of AD this session. Pt ambulating 200' as well without LOB and with supervision only. Pt with no further OT concerns. Pt lives at home with family who can provide 24/7 Supervision as needed.  OT to SIGN OFF. Thank you for referral.   Follow Up Recommendations  No OT follow up;Supervision/Assistance - 24 hour    Equipment Recommendations  None recommended by OT       Precautions / Restrictions Precautions Precautions: Fall Precaution Comments: rib fractures Restrictions Weight Bearing Restrictions: No      Mobility Bed Mobility Overal bed mobility: Needs Assistance Bed Mobility: Supine to Sit     Supine to sit: Supervision     General bed mobility comments: min verbal cuing for technique to decrease pain with mobility  Transfers Overall transfer level: Needs assistance Equipment used: None Transfers: Sit to/from Stand Sit to Stand: Supervision         General transfer comment: supervision for safety    Balance Overall balance assessment: Needs assistance;History of Falls   Sitting balance-Leahy Scale: Good     Standing balance support: Bilateral upper extremity supported;During functional activity Standing balance-Leahy Scale: Fair            ADL either performed or assessed with clinical judgement   ADL Overall ADL's : Needs assistance/impaired        General ADL Comments: Pt is supervision overall for all self care without use of AD this session.      Vision Baseline Vision/History: No visual deficits Patient Visual Report: No change from baseline              Pertinent Vitals/Pain Pain Assessment: Faces Faces Pain Scale: Hurts a little bit Pain Location: R ribcage with deep breath Pain Descriptors / Indicators: Tender Pain Intervention(s): Limited activity within patient's tolerance;Monitored during session;Repositioned     Hand Dominance Right   Extremity/Trunk Assessment Upper Extremity Assessment Upper Extremity Assessment: Overall WFL for tasks assessed   Lower Extremity Assessment Lower Extremity Assessment: Overall WFL for tasks assessed   Cervical / Trunk Assessment Cervical / Trunk Assessment: Normal   Communication Communication Communication: No difficulties   Cognition Arousal/Alertness: Awake/alert Behavior During Therapy: WFL for tasks assessed/performed Overall Cognitive Status: Within Functional Limits for tasks assessed                     Home Living Family/patient expects to be discharged to:: Private residence Living Arrangements: Spouse/significant other Available Help at Discharge: Family Type of Home: House Home Access: Stairs to enter Technical brewer of Steps: 3 Entrance Stairs-Rails: Left Home Layout: Two level;Able to live on main level with bedroom/bathroom Alternate Level Stairs-Number of Steps: flight Alternate Level Stairs-Rails: Right     Bathroom Toilet: Standard     Home Equipment: Walker - 2 wheels;Cane - single point   Additional Comments: has been independent and able to walk with no AD      Prior Functioning/Environment Level of Independence: Independent  Comments: Pt ind with all ADLs and all aspects of mobility.                        Barriers to D/C:    none known at this time          AM-PAC OT "6 Clicks" Daily Activity     Outcome Measure Help from another person eating meals?: None Help from another person taking  care of personal grooming?: None Help from another person toileting, which includes using toliet, bedpan, or urinal?: A Little Help from another person bathing (including washing, rinsing, drying)?: A Little Help from another person to put on and taking off regular upper body clothing?: None Help from another person to put on and taking off regular lower body clothing?: A Little 6 Click Score: 21   End of Session Nurse Communication: (spoke to PA about recommendations for discharge)  Activity Tolerance: Patient tolerated treatment well Patient left: in bed;with call bell/phone within reach;with bed alarm set  OT Visit Diagnosis: History of falling (Z91.81)                Time: 1430-1445 OT Time Calculation (min): 15 min Charges:  OT General Charges $OT Visit: 1 Visit OT Evaluation $OT Eval Low Complexity: 1 Low  Kirsi Hugh P 04/26/2019, 3:04 PM

## 2019-04-26 NOTE — TOC Transition Note (Signed)
Transition of Care [facility]) - CM/SW Discharge Note   Patient Details  Name: Romero Letizia. MRN: [id number] Date of Birth: 02/28/1941  Transition of Care [facility]) CM/SW Contact:  Ella Bodo, RN Phone Number: 04/26/2019, 4:48 PM   Clinical Narrative:  78 yo male with onset of chest trauma from a fall at ground level was admitted with R 6-11 rib fractures, home with family to assist.  PTA, pt independent, lives at home with spouse, who can provide care at dc.  PT recommending [place] follow up, and pt agreeable to services.  Referral to [facility], per pt choice.  No DME needs.       Final next level of care: [place] Barriers to Discharge: Barriers Resolved   Patient Goals and CMS Choice Patient states their goals for this hospitalization and ongoing recovery are:: I want to get home today CMS Medicare.gov Compare Post Acute Care list provided to:: Patient Choice offered to / list presented to : Patient                        Discharge Plan and Services   Discharge Planning Services: CM Consult Post Acute Care Choice: Home Health                    HH Arranged: PT [place]: [place] Date [place]: 04/26/19 Time HH Agency Contacted: 1600 Representative spoke with at [place]: [place] ([place]) Interventions     Readmission Risk Interventions No flowsheet data found.  Reinaldo Raddle, RN, BSN  Trauma/Neuro ICU Case Manager [phone]

## 2019-04-26 NOTE — Progress Notes (Signed)
Results for [name], [name] (MRN [id number]) as of 04/26/2019 13:07  Ref. Range 04/25/2019 14:55 04/25/2019 16:37 04/25/2019 21:28 04/26/2019 00:38 04/26/2019 04:34 04/26/2019 08:08 04/26/2019 12:25  Glucose-Capillary Latest Ref Range: 70 - 99 mg/dL 271 (H) 249 (H) 325 (H) 277 (H) 215 (H) 194 (H) 179 (H)    Results for Andrew Wu, Andrew Wu (MRN [id number]) as of 04/26/2019 13:05  Ref. Range 08/06/2018 10:27 04/25/2019 18:57  Hemoglobin A1C Latest Ref Range: 4.8 - 5.6 % 7.7 (H) 9.1 (H)      Admit Right 6-11th rib fractures, some segmental  History: DM   Home DM Meds: Tresiba 16 units QHS       Humalog 10 units TID    Current Orders: Levemir 16 units QHS      Novolog Moderate Correction Scale/ SSI (0-15 units) TID AC + HS      Novolog 6 units TID with meals   ENDOCRINOLOGIST: Dr. Talbot Grumbling seen 10/2018     Novolog started yest at 6pm and Levemir started yest at bedtime.  CBGs have improved since admission and since Insulin started.  Spoke with pt by phone this afternoon.  Reviewed current A1c of 9.1% with pt.  Pt stated he takes his insulin as instructed everyday and checks his CBGs 4 times per day.  Has follow up appt with Dr. Chalmers Cater but can't remember the date.  Has access to meds and meter supplies.  Explained to pt that we are checking his CBGs TID AC + HS and trying to give him insulin to match his home insulin regimen.  Patient did not have any questions for me regarding his diabetes care.     --Will follow patient during hospitalization--  Wyn Quaker RN, MSN, CDE Diabetes Coordinator Inpatient Glycemic Control Team Team Pager: [phone] (8a-5p)

## 2019-04-26 NOTE — Discharge Summary (Signed)
Physician Discharge Summary  Patient ID: Andrew Wu. MRN: [id number] DOB/AGE: 10-27-1940 78 y.o.  Admit date: 04/25/2019 Discharge date: 04/26/2019  Discharge Diagnoses Ground Level Fall Right 6-11th rib fractures, some segmental Type 2 Diabetes Mellitus HTN Hx of SA node dysfunction s/p cardiac pacemaker  Consultants Internal Medicine  Procedures None  Hospital Course: Patient is a 78 year old male with PMH of HTN, T2DM, and pacemaker who presented to [facility] today with complaint of R sided chest pain after fall around 5 PM Monday. Patient reports he was taking the trash out and tripped over trash bag landing on R ribs. Patient denies hitting head or LOC. He heard some cracking and popping and had some pain but tried to manage pain at home. Since the fall his pain has been mild at rest but is significantly worse with movement. Patient denies recent fever, chills, cough, SOB, palpitations, abdominal pain, n/v, diarrhea, constipation, numbness or tingling. Patient reports blood glucose has been well controlled until recently, did not take insulin today because he had not eaten. Allergic to lisinopril and uncoated ASA. No blood thinning medications. Patient denies alcohol, tobacco or illicit drug use. Patient is retired and lives at home with his wife. Workup showed right rib fractures without pneumothorax. Patient admitted to the trauma service for pain control and PT/OT. Internal medicine consulted for hyperglycemia and recommended resumption of usual medications on discharge. Follow up CXR 7/23 was stable. PT/OT evaluated patient and recommended home PT and a walker. Patient reports he has a walker at home that he can use. On 04/26/19 patient was tolerating a diet, voiding appropriately, VSS, and pain well controlled. He is discharged home with his wife in stable condition. Follow up with primary care as outlined below.   I have personally looked this patient up in the [place] Controlled Substance  Database and reviewed their medications.   Allergies as of 04/26/2019      Reactions   Lisinopril Swelling   Facial swelling   Asa [aspirin] Other (See Comments)   Stomach hurts - is able to take enteric coated aspirin      Medication List    TAKE these medications   acetaminophen 325 MG tablet Commonly known as: TYLENOL Take 2 tablets (650 mg total) by mouth every 6 (six) hours as needed for mild pain.   aspirin EC 81 MG tablet Take 81 mg by mouth daily.   atorvastatin 20 MG tablet Commonly known as: LIPITOR Take 20 mg by mouth at bedtime.   Combigan 0.2-0.5 % ophthalmic solution Generic drug: brimonidine-timolol Place 1 drop into the left eye 2 (two) times daily.   ezetimibe 10 MG tablet Commonly known as: ZETIA Take 10 mg by mouth daily.   insulin lispro 100 UNIT/ML injection Commonly known as: HUMALOG Inject 10 Units into the skin 3 (three) times daily before meals.   latanoprost 0.005 % ophthalmic solution Commonly known as: XALATAN Place 1 drop into both eyes at bedtime.   methocarbamol 500 MG tablet Commonly known as: ROBAXIN Take 1 tablet (500 mg total) by mouth 3 (three) times daily.   NETI POT SINUS [place] NA Place into both nostrils as needed (for sinus congestion).   traMADol 50 MG tablet Commonly known as: ULTRAM Take 1-2 tablets (50-100 mg total) by mouth every 6 (six) hours as needed for moderate pain or severe pain (50mg  for moderate, 100mg  for severe).   Tyler Aas FlexTouch 100 UNIT/ML Sopn FlexTouch Pen Generic drug: insulin degludec Inject 16 Units into the  skin at bedtime.        Follow-up Information    Jacelyn Pi, MD. Call.   Specialty: Endocrinology Why: Call and schedule an appointment to be seen in 1-2 weeks for continued management of rib fractures.  Contact information: [address] [place] [id number] [phone]           Signed: Brigid Re , [facility] 04/26/2019, 3:04  PM Pager: [phone]

## 2019-04-26 NOTE — Final Consult Note (Deleted)
    Reason for consult/CC: Diabetes mellitus/hyperglycemia  Patient's chart reviewed. Patient interviewed and evaluated at bedside. Patient with no concerns today; hoping to be discharged.  Vitals:   04/25/19 2125 04/26/19 0525  BP: (!) 145/72 (!) 142/68  Pulse: 68 71  Resp: 18 17  Temp: 98.6 F (37 C) 98.5 F (36.9 C)  SpO2: 96% 97%   General: appears well. No distress   Assessment/Discharge recommendations:  Diabetes mellitus, insulin dependent -Continue home Tresiba 16 units qhs (first dose tonight) -Continue home Humalog 10 units qAC daily (first dose depending on last dose received in the hospital)  Hyperlipidemia -Continue home Lipitor 20 mg qhs and Zetia 10 mg daily  If patient does not discharge today, would continue current dosing for insulin. Will sign off at this time. Please call back with further questions. Thanks!  Cordelia Poche, MD [facility] 04/26/2019, 2:11 PM

## 2019-04-26 NOTE — Progress Notes (Signed)
Patient discharged to home. Verbalized understanding of all discharge instructions including activity restrictions, home health care, discharge medications and follow up MD visits.

## 2019-04-26 NOTE — Progress Notes (Signed)
[facility] Progress Note     Subjective: CC: R chest pain Patient reports R chest is still sore but overall feeling much better. Denies SOB. Pulling up to 1250 on IS. Denies abdominal pain, n/v. Hopeful to be able to go home this afternoon.   Objective: Vital signs in last 24 hours: Temp:  [98.5 F (36.9 C)-98.9 F (37.2 C)] 98.5 F (36.9 C) (07/23 0525) Pulse Rate:  [59-71] 71 (07/23 0525) Resp:  [15-20] 17 (07/23 0525) BP: (139-180)/(60-80) 142/68 (07/23 0525) SpO2:  [95 %-98 %] 97 % (07/23 0525) Weight:  [73.9 kg] 73.9 kg (07/22 1551) Last BM Date: 04/24/19  Intake/Output from previous day: 07/22 0701 - 07/23 0700 In: 850.2 [I.V.:850.2] Out: 300 [Urine:300] Intake/Output this shift: No intake/output data recorded.  PE: Gen:  Alert, NAD, pleasant Card:  Regular rate and rhythm, pedal pulses 2+ BL Pulm:  Normal effort, clear to auscultation bilaterally Abd: Soft, non-tender, non-distended, +BS Skin: warm and dry, no rashes  Psych: A&Ox3   Lab Results:  Recent Labs    04/25/19 1004 04/26/19 0306  WBC 11.0* 8.6  HGB 13.5 13.2  HCT 42.4 41.1  PLT 143* 147*   BMET Recent Labs    04/25/19 1004 04/26/19 0306  NA 133* 139  K 4.2 4.2  CL 102 106  CO2 21* 23  GLUCOSE 449* 213*  BUN 27* 20  CREATININE 1.03 1.01  CALCIUM 9.4 9.6   PT/INR Recent Labs    04/25/19 1004  LABPROT 13.4  INR 1.0   CMP     Component Value Date/Time   NA 139 04/26/2019 0306   K 4.2 04/26/2019 0306   CL 106 04/26/2019 0306   CO2 23 04/26/2019 0306   GLUCOSE 213 (H) 04/26/2019 0306   BUN 20 04/26/2019 0306   CREATININE 1.01 04/26/2019 0306   CALCIUM 9.6 04/26/2019 0306   PROT 6.4 (L) 04/25/2019 1004   ALBUMIN 3.9 04/25/2019 1004   AST 25 04/25/2019 1004   ALT 29 04/25/2019 1004   ALKPHOS 68 04/25/2019 1004   BILITOT 1.3 (H) 04/25/2019 1004   GFRNONAA >60 04/26/2019 0306   GFRAA >60 04/26/2019 0306   Lipase     Component Value Date/Time   LIPASE 28  04/25/2019 1004       Studies/Results: Ct Chest W Contrast  Result Date: 04/25/2019 CLINICAL DATA:  Fall, trauma, suspect flail chest EXAM: CT CHEST, ABDOMEN, AND PELVIS WITH CONTRAST TECHNIQUE: Multidetector CT imaging of the chest, abdomen and pelvis was performed following the standard protocol during bolus administration of intravenous contrast. CONTRAST:  118mL OMNIPAQUE IOHEXOL 300 MG/ML  SOLN COMPARISON:  08/06/2018 FINDINGS: CT CHEST FINDINGS Cardiovascular: Aortic atherosclerosis. Left chest multi lead pacer. Normal heart size. Three-vessel coronary artery calcifications. No pericardial effusion. Mediastinum/Nodes: No enlarged mediastinal, hilar, or axillary lymph nodes. Small hiatal hernia. Thyroid gland, trachea, and esophagus demonstrate no significant findings. Lungs/Pleura: Mild-to-moderate centrilobular emphysema. Small right pleural effusion associated atelectasis or consolidation. Bandlike scarring or atelectasis of the left lung base. Musculoskeletal: No chest wall mass. There are multilevel, minimally displaced multisegment fractures of the lateral and posterior right ribs, involving the right sixth through eleventh ribs. CT ABDOMEN PELVIS FINDINGS Hepatobiliary: No solid liver abnormality is seen. No gallstones, gallbladder wall thickening, or biliary dilatation. Pancreas: Unremarkable. No pancreatic ductal dilatation or surrounding inflammatory changes. Spleen: Normal in size without significant abnormality. Adrenals/Urinary Tract: Unchanged small benign nodule of the left adrenal gland. Kidneys are normal, without renal calculi, solid lesion, or hydronephrosis.  Bladder is unremarkable. Stomach/Bowel: Stomach is within normal limits. No evidence of bowel wall thickening, distention, or inflammatory changes. Vascular/Lymphatic: Severe mixed aortic atherosclerosis. No enlarged abdominal or pelvic lymph nodes. Reproductive: Left hydrocele. Brachytherapy pellets of the prostate. Other: No  abdominal wall hernia or abnormality. No abdominopelvic ascites. Musculoskeletal: No acute or significant osseous findings. Nonacute pars defects of L5. IMPRESSION: 1. There are multilevel, minimally displaced multisegment fractures of the lateral and posterior right ribs, involving the right sixth through eleventh ribs. There is potential for flail physiology given this configuration of fractures. There is a small associated right pleural effusion without significant pneumothorax. 2. No other evidence of acute traumatic injury to the chest, abdomen, or pelvis. 3.  Chronic and incidental findings as detailed above. Electronically Signed   By: Eddie Candle M.D.   On: 04/25/2019 12:34   Ct Abdomen Pelvis W Contrast  Result Date: 04/25/2019 CLINICAL DATA:  Fall, trauma, suspect flail chest EXAM: CT CHEST, ABDOMEN, AND PELVIS WITH CONTRAST TECHNIQUE: Multidetector CT imaging of the chest, abdomen and pelvis was performed following the standard protocol during bolus administration of intravenous contrast. CONTRAST:  142mL OMNIPAQUE IOHEXOL 300 MG/ML  SOLN COMPARISON:  08/06/2018 FINDINGS: CT CHEST FINDINGS Cardiovascular: Aortic atherosclerosis. Left chest multi lead pacer. Normal heart size. Three-vessel coronary artery calcifications. No pericardial effusion. Mediastinum/Nodes: No enlarged mediastinal, hilar, or axillary lymph nodes. Small hiatal hernia. Thyroid gland, trachea, and esophagus demonstrate no significant findings. Lungs/Pleura: Mild-to-moderate centrilobular emphysema. Small right pleural effusion associated atelectasis or consolidation. Bandlike scarring or atelectasis of the left lung base. Musculoskeletal: No chest wall mass. There are multilevel, minimally displaced multisegment fractures of the lateral and posterior right ribs, involving the right sixth through eleventh ribs. CT ABDOMEN PELVIS FINDINGS Hepatobiliary: No solid liver abnormality is seen. No gallstones, gallbladder wall thickening,  or biliary dilatation. Pancreas: Unremarkable. No pancreatic ductal dilatation or surrounding inflammatory changes. Spleen: Normal in size without significant abnormality. Adrenals/Urinary Tract: Unchanged small benign nodule of the left adrenal gland. Kidneys are normal, without renal calculi, solid lesion, or hydronephrosis. Bladder is unremarkable. Stomach/Bowel: Stomach is within normal limits. No evidence of bowel wall thickening, distention, or inflammatory changes. Vascular/Lymphatic: Severe mixed aortic atherosclerosis. No enlarged abdominal or pelvic lymph nodes. Reproductive: Left hydrocele. Brachytherapy pellets of the prostate. Other: No abdominal wall hernia or abnormality. No abdominopelvic ascites. Musculoskeletal: No acute or significant osseous findings. Nonacute pars defects of L5. IMPRESSION: 1. There are multilevel, minimally displaced multisegment fractures of the lateral and posterior right ribs, involving the right sixth through eleventh ribs. There is potential for flail physiology given this configuration of fractures. There is a small associated right pleural effusion without significant pneumothorax. 2. No other evidence of acute traumatic injury to the chest, abdomen, or pelvis. 3.  Chronic and incidental findings as detailed above. Electronically Signed   By: Eddie Candle M.D.   On: 04/25/2019 12:34    Anti-infectives: Anti-infectives (From admission, onward)   None       Assessment/Plan Ground Level Fall Right 6-11th rib fractures, some segmental - multimodal pain control, IS, pulm toilet - repeat CXR looks ok on prelim, final read pending - PT/OT T2DM - SSI HTN - prn hydralazine Hx of SA node dysfunction s/p cardiac pacemaker - cardiac monitor  FEN: CM diet, SLIV VTE: SCDs, lovenox ID: no abx indicated   Dispo: PT/OT. Possible discharge this afternoon   LOS: 0 days    Brigid Re , [facility] 04/26/2019, 8:06 AM Pager:  [phone]  

## 2019-04-26 NOTE — Evaluation (Signed)
Physical Therapy Evaluation Patient Details Name: Andrew Wu. MRN: [id number] DOB: [birth year] Today's Date: 04/26/2019   History of Present Illness   78 yo male with onset of chest trauma from a fall at ground level was admitted with R 6-11 rib fractures, home with family to assist. PMHx:  HTN, DM, pacemaker, sleep apnea, sinoatrial node dysfunction,   Clinical Impression  Pt was seen for mobility and noted RW was helpful for controlling balance today.  He is open to potentially needing one, but has an older relative of his wife's at home who may be able to share for him.  Will possibly need one.  Follow acutely for progressing his mobility and strength, and to increase balance to hopefully get past the walker sooner.  O2 sats at baseline were 93% but after walk were 95%, pt left up in chair with alarm on to maintain safety.  Asked pt to have nursing assist back to bed.    Follow Up Recommendations Home health PT;Supervision for mobility/OOB    Equipment Recommendations  Rolling walker with 5" wheels(unless he can use walker at the house)    Recommendations for Other Services       Precautions / Restrictions Precautions Precautions: Fall Precaution Comments: rib fractures Restrictions Weight Bearing Restrictions: No Other Position/Activity Restrictions: use care with gait belt      Mobility  Bed Mobility Overal bed mobility: Needs Assistance Bed Mobility: Supine to Sit     Supine to sit: Min assist     General bed mobility comments: min to sit up on side of bed  Transfers Overall transfer level: Needs assistance Equipment used: 1 person hand held assist Transfers: Sit to/from Stand Sit to Stand: From elevated surface;Min assist         General transfer comment: min to control power up and then could steady with min guard  Ambulation/Gait Ambulation/Gait assistance: Min guard Gait Distance (Feet): 140 Feet Assistive device: Rolling walker (2 wheeled) Gait  Pattern/deviations: Step-through pattern Gait velocity: normal Gait velocity interpretation: <1.31 ft/sec, indicative of household ambulator General Gait Details: pt is steadier with RW, no lateral stepping and is able to turn corners more in control.  Stairs            Wheelchair Mobility    Modified Rankin (Stroke Patients Only)       Balance Overall balance assessment: Needs assistance;History of Falls Sitting-balance support: Feet supported Sitting balance-Leahy Scale: Good     Standing balance support: Bilateral upper extremity supported;During functional activity Standing balance-Leahy Scale: Fair                               Pertinent Vitals/Pain Pain Assessment: Faces Faces Pain Scale: Hurts little more Pain Location: R ribcage with deep breath Pain Descriptors / Indicators: Tender Pain Intervention(s): Limited activity within patient's tolerance;Monitored during session;Premedicated before session;Repositioned    Home Living Family/patient expects to be discharged to:: Private residence Living Arrangements: Spouse/significant other Available Help at Discharge: Family Type of Home: House Home Access: Stairs to enter Entrance Stairs-Rails: Left Entrance Stairs-Number of Steps: 3 Home Layout: Two level;Able to live on main level with bedroom/bathroom Home Equipment: Gilford Rile - 2 wheels;Cane - single point Additional Comments: has been independent and able to walk with no AD(wife is caring for an older relative)    Prior Function Level of Independence: Independent  Hand Dominance   Dominant Hand: Right    Extremity/Trunk Assessment   Upper Extremity Assessment Upper Extremity Assessment: Overall WFL for tasks assessed    Lower Extremity Assessment Lower Extremity Assessment: Overall WFL for tasks assessed    Cervical / Trunk Assessment Cervical / Trunk Assessment: Normal  Communication   Communication: No  difficulties  Cognition Arousal/Alertness: Awake/alert Behavior During Therapy: WFL for tasks assessed/performed Overall Cognitive Status: Within Functional Limits for tasks assessed                                        General Comments General comments (skin integrity, edema, etc.): pt was able to walk without the walker but with HHA, and with walker was min guard for safety, cues for directing and controlling speed on walker    Exercises     Assessment/Plan    PT Assessment Patient needs continued PT services  PT Problem List Decreased range of motion;Decreased activity tolerance;Decreased balance;Decreased mobility;Decreased coordination;Decreased knowledge of use of DME;Decreased safety awareness;Pain       PT Treatment Interventions DME instruction;Gait training;Stair training;Functional mobility training;Therapeutic activities;Therapeutic exercise;Balance training;Cognitive remediation    PT Goals (Current goals can be found in the Care Plan section)  Acute Rehab PT Goals Patient Stated Goal: to get stronger and feel better PT Goal Formulation: With patient Time For Goal Achievement: 05/10/19 Potential to Achieve Goals: Good    Frequency Min 3X/week   Barriers to discharge Inaccessible home environment has stairs to enter house    Co-evaluation               AM-PAC PT "6 Clicks" Mobility  Outcome Measure Help needed turning from your back to your side while in a flat bed without using bedrails?: A Little Help needed moving from lying on your back to sitting on the side of a flat bed without using bedrails?: A Little Help needed moving to and from a bed to a chair (including a wheelchair)?: A Little Help needed standing up from a chair using your arms (e.g., wheelchair or bedside chair)?: A Little Help needed to walk in hospital room?: A Little Help needed climbing 3-5 steps with a railing? : A Little 6 Click Score: 18    End of Session    Activity Tolerance: Patient tolerated treatment well;Other (comment)(safety with walker) Patient left: in chair;with call bell/phone within reach;with chair alarm set Nurse Communication: Mobility status PT Visit Diagnosis: Unsteadiness on feet (R26.81);History of falling (Z91.81)    Time: 3151-7616 PT Time Calculation (min) (ACUTE ONLY): 29 min   Charges:   PT Evaluation $PT Eval Moderate Complexity: 1 Mod PT Treatments $Gait Training: 8-22 mins       Ramond Dial 04/26/2019, 11:01 AM   Mee Hives, PT MS Acute Rehab Dept. Number: [place] and [place]

## 2019-04-28 DIAGNOSIS — Z950 Presence of cardiac pacemaker: Secondary | ICD-10-CM | POA: Diagnosis not present

## 2019-04-28 DIAGNOSIS — W010XXD Fall on same level from slipping, tripping and stumbling without subsequent striking against object, subsequent encounter: Secondary | ICD-10-CM | POA: Diagnosis not present

## 2019-04-28 DIAGNOSIS — E1165 Type 2 diabetes mellitus with hyperglycemia: Secondary | ICD-10-CM | POA: Diagnosis not present

## 2019-04-28 DIAGNOSIS — S2242XD Multiple fractures of ribs, left side, subsequent encounter for fracture with routine healing: Secondary | ICD-10-CM | POA: Diagnosis not present

## 2019-04-28 DIAGNOSIS — Z794 Long term (current) use of insulin: Secondary | ICD-10-CM | POA: Diagnosis not present

## 2019-04-28 DIAGNOSIS — I10 Essential (primary) hypertension: Secondary | ICD-10-CM | POA: Diagnosis not present

## 2019-04-28 DIAGNOSIS — Z7982 Long term (current) use of aspirin: Secondary | ICD-10-CM | POA: Diagnosis not present

## 2019-04-28 DIAGNOSIS — I495 Sick sinus syndrome: Secondary | ICD-10-CM | POA: Diagnosis not present

## 2019-05-01 DIAGNOSIS — I495 Sick sinus syndrome: Secondary | ICD-10-CM | POA: Diagnosis not present

## 2019-05-01 DIAGNOSIS — E1165 Type 2 diabetes mellitus with hyperglycemia: Secondary | ICD-10-CM | POA: Diagnosis not present

## 2019-05-01 DIAGNOSIS — I10 Essential (primary) hypertension: Secondary | ICD-10-CM | POA: Diagnosis not present

## 2019-05-01 DIAGNOSIS — Z950 Presence of cardiac pacemaker: Secondary | ICD-10-CM | POA: Diagnosis not present

## 2019-05-01 DIAGNOSIS — S2242XD Multiple fractures of ribs, left side, subsequent encounter for fracture with routine healing: Secondary | ICD-10-CM | POA: Diagnosis not present

## 2019-05-01 DIAGNOSIS — Z7982 Long term (current) use of aspirin: Secondary | ICD-10-CM | POA: Diagnosis not present

## 2019-05-01 NOTE — ED Provider Notes (Signed)
[place]  Provider Note   CSN: [id number] Arrival date & time: 04/25/19  1534     History   Chief Complaint Chief Complaint  Patient presents with  . Fall    HPI Andrew Wu. is a 78 y.o. male presenting as transfer from [facility] ED.  Patient sent from [facility] ED after being diagnosed with multiple rib fractures on the right side and small pleural effusion on CT after mechanical fall down the stairs x2 days ago. He describes his pain as severe and aching. Pain is worse with movement.  Previous attending discussed transfer with trauma attending Dr. Dema Severin.   He admits to chest pain, flank pain, and non productive cough. He denies fever, chills, shortness of breath, abdominal pain, hematuria, neck pain.  History provided by patient with additional history obtained from chart review.       Past Medical History:  Diagnosis Date  . Arthritis   . Cardiac pacemaker in situ   . Depressive disorder, not elsewhere classified   . Presence of permanent cardiac pacemaker   . Rib fractures 04/25/2019   FROM FALL AT HOME   . Sinoatrial node dysfunction (HCC)   . Type II or unspecified type diabetes mellitus without mention of complication, not stated as uncontrolled   . Unspecified essential hypertension   . Unspecified sleep apnea     Patient Active Problem List   Diagnosis Date Noted  . Rib fractures   . Chest trauma 08/06/2018  . Diabetes mellitus ([place]) 03/11/2009  . DEPRESSION 03/11/2009  . HYPERTENSION, UNSPECIFIED 03/11/2009  . SINOATRIAL NODE DYSFUNCTION 03/11/2009  . ARTHRITIS 03/11/2009  . SLEEP APNEA 03/11/2009  . PACEMAKER, PERMANENT 03/11/2009    Past Surgical History:  Procedure Laterality Date  . APPENDECTOMY    . HERNIA REPAIR    . INSERT / REPLACE / REMOVE PACEMAKER  2006 ?   [name] pacemaker  02/23/08   medtronic Adapta-sinus node dysfunction   . PROSTATE SURGERY     SEED IMPLANT        Home Medications    Prior to Admission  medications   Medication Sig Start Date End Date Taking? Authorizing Provider  aspirin EC 81 MG tablet Take 81 mg by mouth daily.   Yes [provider]  atorvastatin (LIPITOR) 20 MG tablet Take 20 mg by mouth at bedtime.    Yes [provider]  COMBIGAN 0.2-0.5 % ophthalmic solution Place 1 drop into the left eye 2 (two) times daily.  02/28/19  Yes [provider]  ezetimibe (ZETIA) 10 MG tablet Take 10 mg by mouth daily. 03/02/19  Yes [provider]  insulin degludec (TRESIBA FLEXTOUCH) 100 UNIT/ML SOPN FlexTouch Pen Inject 16 Units into the skin at bedtime.    Yes [provider]  insulin lispro (HUMALOG) 100 UNIT/ML injection Inject 10 Units into the skin 3 (three) times daily before meals.    Yes [provider]  latanoprost (XALATAN) 0.005 % ophthalmic solution Place 1 drop into both eyes at bedtime. 02/27/19  Yes [provider]  Sodium Chloride-Sodium Bicarb (NETI POT SINUS [place] NA) Place into both nostrils as needed (for sinus congestion).   Yes [provider]  acetaminophen (TYLENOL) 325 MG tablet Take 2 tablets (650 mg total) by mouth every 6 (six) hours as needed for mild pain. 04/26/19   Rayburn, Floyce Stakes, PA-C  methocarbamol (ROBAXIN) 500 MG tablet Take 1 tablet (500 mg total) by mouth 3 (three) times daily. 04/26/19  Rayburn, Kelly A, PA-C  traMADol (ULTRAM) 50 MG tablet Take 1-2 tablets (50-100 mg total) by mouth every 6 (six) hours as needed for moderate pain or severe pain (50mg  for moderate, 100mg  for severe). 04/26/19   Rayburn, Floyce Stakes, PA-C    Family History History reviewed. No pertinent family history.  Social History Social History   Tobacco Use  . Smoking status: Former Smoker    Types: Cigarettes  . Smokeless tobacco: Never Used  Substance Use Topics  . Alcohol use: No  . Drug use: No     Allergies   Lisinopril and Asa [aspirin]   Review of Systems Review of Systems  Constitutional:  Negative for chills and fever.  HENT: Negative for congestion, rhinorrhea, sinus pressure and sore throat.   Eyes: Negative for pain and redness.  Respiratory: Positive for cough. Negative for shortness of breath and wheezing.   Cardiovascular: Positive for chest pain. Negative for palpitations.  Gastrointestinal: Negative for abdominal pain, constipation, diarrhea, nausea and vomiting.  Genitourinary: Positive for flank pain. Negative for dysuria.  Musculoskeletal: Negative for arthralgias, back pain, myalgias and neck pain.  Skin: Negative for rash and wound.  Neurological: Negative for dizziness, syncope, weakness, numbness and headaches.  Psychiatric/Behavioral: Negative for confusion.     Physical Exam Updated Vital Signs BP (!) 142/68 (BP Location: Left Arm)   Pulse 71   Temp 98.5 F (36.9 C) (Oral)   Resp 17   Ht 5\' 7"  (1.702 m)   Wt 73.9 kg   SpO2 97%   BMI 25.53 kg/m   Physical Exam Vitals signs and nursing note reviewed.  Constitutional:      General: He is not in acute distress.    Appearance: He is not ill-appearing.  HENT:     Head: Normocephalic and atraumatic.     Right Ear: Tympanic membrane and external ear normal.     Left Ear: Tympanic membrane and external ear normal.     Nose: Nose normal.     Mouth/Throat:     Mouth: Mucous membranes are moist.     Pharynx: Oropharynx is clear.  Eyes:     General: No scleral icterus.       Right eye: No discharge.        Left eye: No discharge.     Extraocular Movements: Extraocular movements intact.     Conjunctiva/sclera: Conjunctivae normal.     Pupils: Pupils are equal, round, and reactive to light.  Neck:     Musculoskeletal: Normal range of motion.     Vascular: No JVD.  Cardiovascular:     Rate and Rhythm: Normal rate and regular rhythm.     Pulses: Normal pulses.          Radial pulses are 2+ on the right side and 2+ on the left side.     Heart sounds: Normal heart sounds.  Pulmonary:     Comments:  Lungs clear to auscultation in all fields. Symmetric chest rise. No wheezing, rales, or rhonchi. Chest:     Comments: Tenderness to palpation over the right posterior lateral chest wall. No bruising. No paradoxical motion.   Abdominal:     Comments: Abdomen is soft, non-distended. With palpation of abdomen pt reports referred chest wall pain. No peritoneal signs.  Musculoskeletal: Normal range of motion.  Skin:    General: Skin is warm and dry.     Capillary Refill: Capillary refill takes less than 2 seconds.  Neurological:     Mental  Status: He is oriented to person, place, and time.     GCS: GCS eye subscore is 4. GCS verbal subscore is 5. GCS motor subscore is 6.     Comments: Fluent speech, no facial droop.  Psychiatric:        Behavior: Behavior normal.      ED Treatments / Results  Labs (all labs ordered are listed, but only abnormal results are displayed) Labs Reviewed  HEMOGLOBIN A1C - Abnormal; Notable for the following components:      Result Value   Hgb A1c MFr Bld 9.1 (*)    All other components within normal limits  CBC - Abnormal; Notable for the following components:   Platelets 147 (*)    All other components within normal limits  BASIC METABOLIC PANEL - Abnormal; Notable for the following components:   Glucose, Bld 213 (*)    All other components within normal limits  GLUCOSE, CAPILLARY - Abnormal; Notable for the following components:   Glucose-Capillary 325 (*)    All other components within normal limits  GLUCOSE, CAPILLARY - Abnormal; Notable for the following components:   Glucose-Capillary 277 (*)    All other components within normal limits  GLUCOSE, CAPILLARY - Abnormal; Notable for the following components:   Glucose-Capillary 215 (*)    All other components within normal limits  GLUCOSE, CAPILLARY - Abnormal; Notable for the following components:   Glucose-Capillary 194 (*)    All other components within normal limits  GLUCOSE, CAPILLARY -  Abnormal; Notable for the following components:   Glucose-Capillary 179 (*)    All other components within normal limits  CBG MONITORING, ED - Abnormal; Notable for the following components:   Glucose-Capillary 249 (*)    All other components within normal limits    EKG EKG Interpretation  Date/Time:  Wednesday April 25 2019 15:42:37 EDT Ventricular Rate:  64 PR Interval:    QRS Duration: 154 QT Interval:  433 QTC Calculation: 447 R Axis:   -62 Text Interpretation:  Sinus rhythm RBBB and LAFB pacer spikes not seen when compared to earlier in the day Confirmed by Sherwood Gambler [phone]) on 04/26/2019 12:15:36 PM   Radiology No results found.  Procedures Procedures (including critical care time)  Medications Ordered in ED Medications - No data to display   Initial Impression / Assessment and Plan / ED Course  I have reviewed the triage vital signs and the nursing notes.  Pertinent labs & imaging results that were available during my care of the patient were reviewed by me and considered in my medical decision making (see chart for details).  On patient's arrival to ED he was evaluated at the bedside by Dr. Dema Severin and Rayburn PA-C with admission orders being put in. Pt is stable and resting comfortably on stretcher, in no acute distress. Findings and plan of care discussed with supervising physician Dr. Johnney Killian.  This note was prepared using Dragon voice recognition software and may include unintentional dictation errors due to the inherent limitations of voice recognition software.   Final Clinical Impressions(s) / ED Diagnoses   Final diagnoses:  Rib fractures       Cherre Robins, PA-C 05/01/19 2233    Charlesetta Shanks, MD 05/05/19 1352

## 2019-05-04 DIAGNOSIS — I10 Essential (primary) hypertension: Secondary | ICD-10-CM | POA: Diagnosis not present

## 2019-05-04 DIAGNOSIS — Z7982 Long term (current) use of aspirin: Secondary | ICD-10-CM | POA: Diagnosis not present

## 2019-05-04 DIAGNOSIS — Z950 Presence of cardiac pacemaker: Secondary | ICD-10-CM | POA: Diagnosis not present

## 2019-05-04 DIAGNOSIS — S2242XD Multiple fractures of ribs, left side, subsequent encounter for fracture with routine healing: Secondary | ICD-10-CM | POA: Diagnosis not present

## 2019-05-04 DIAGNOSIS — E1165 Type 2 diabetes mellitus with hyperglycemia: Secondary | ICD-10-CM | POA: Diagnosis not present

## 2019-05-04 DIAGNOSIS — I495 Sick sinus syndrome: Secondary | ICD-10-CM | POA: Diagnosis not present

## 2019-05-07 DIAGNOSIS — E1165 Type 2 diabetes mellitus with hyperglycemia: Secondary | ICD-10-CM | POA: Diagnosis not present

## 2019-05-07 DIAGNOSIS — Z09 Encounter for follow-up examination after completed treatment for conditions other than malignant neoplasm: Secondary | ICD-10-CM | POA: Diagnosis not present

## 2019-05-07 DIAGNOSIS — H409 Unspecified glaucoma: Secondary | ICD-10-CM | POA: Diagnosis not present

## 2019-05-07 DIAGNOSIS — E7800 Pure hypercholesterolemia, unspecified: Secondary | ICD-10-CM | POA: Diagnosis not present

## 2019-05-08 DIAGNOSIS — S2242XD Multiple fractures of ribs, left side, subsequent encounter for fracture with routine healing: Secondary | ICD-10-CM | POA: Diagnosis not present

## 2019-05-08 DIAGNOSIS — Z7982 Long term (current) use of aspirin: Secondary | ICD-10-CM | POA: Diagnosis not present

## 2019-05-08 DIAGNOSIS — Z950 Presence of cardiac pacemaker: Secondary | ICD-10-CM | POA: Diagnosis not present

## 2019-05-08 DIAGNOSIS — I495 Sick sinus syndrome: Secondary | ICD-10-CM | POA: Diagnosis not present

## 2019-05-08 DIAGNOSIS — E1165 Type 2 diabetes mellitus with hyperglycemia: Secondary | ICD-10-CM | POA: Diagnosis not present

## 2019-05-08 DIAGNOSIS — I10 Essential (primary) hypertension: Secondary | ICD-10-CM | POA: Diagnosis not present

## 2019-05-11 DIAGNOSIS — Z7982 Long term (current) use of aspirin: Secondary | ICD-10-CM | POA: Diagnosis not present

## 2019-05-11 DIAGNOSIS — Z950 Presence of cardiac pacemaker: Secondary | ICD-10-CM | POA: Diagnosis not present

## 2019-05-11 DIAGNOSIS — I10 Essential (primary) hypertension: Secondary | ICD-10-CM | POA: Diagnosis not present

## 2019-05-11 DIAGNOSIS — E1165 Type 2 diabetes mellitus with hyperglycemia: Secondary | ICD-10-CM | POA: Diagnosis not present

## 2019-05-11 DIAGNOSIS — I495 Sick sinus syndrome: Secondary | ICD-10-CM | POA: Diagnosis not present

## 2019-05-11 DIAGNOSIS — S2242XD Multiple fractures of ribs, left side, subsequent encounter for fracture with routine healing: Secondary | ICD-10-CM | POA: Diagnosis not present

## 2019-05-14 DIAGNOSIS — I10 Essential (primary) hypertension: Secondary | ICD-10-CM | POA: Diagnosis not present

## 2019-05-14 DIAGNOSIS — Z7982 Long term (current) use of aspirin: Secondary | ICD-10-CM | POA: Diagnosis not present

## 2019-05-14 DIAGNOSIS — S2242XD Multiple fractures of ribs, left side, subsequent encounter for fracture with routine healing: Secondary | ICD-10-CM | POA: Diagnosis not present

## 2019-05-14 DIAGNOSIS — E1165 Type 2 diabetes mellitus with hyperglycemia: Secondary | ICD-10-CM | POA: Diagnosis not present

## 2019-05-14 DIAGNOSIS — Z950 Presence of cardiac pacemaker: Secondary | ICD-10-CM | POA: Diagnosis not present

## 2019-05-15 DIAGNOSIS — I495 Sick sinus syndrome: Secondary | ICD-10-CM | POA: Diagnosis not present

## 2019-05-15 DIAGNOSIS — E1165 Type 2 diabetes mellitus with hyperglycemia: Secondary | ICD-10-CM | POA: Diagnosis not present

## 2019-05-15 DIAGNOSIS — Z7982 Long term (current) use of aspirin: Secondary | ICD-10-CM | POA: Diagnosis not present

## 2019-05-15 DIAGNOSIS — S2242XD Multiple fractures of ribs, left side, subsequent encounter for fracture with routine healing: Secondary | ICD-10-CM | POA: Diagnosis not present

## 2019-05-15 DIAGNOSIS — Z950 Presence of cardiac pacemaker: Secondary | ICD-10-CM | POA: Diagnosis not present

## 2019-05-15 DIAGNOSIS — I10 Essential (primary) hypertension: Secondary | ICD-10-CM | POA: Diagnosis not present

## 2019-05-16 DIAGNOSIS — E1165 Type 2 diabetes mellitus with hyperglycemia: Secondary | ICD-10-CM | POA: Diagnosis not present

## 2019-05-16 DIAGNOSIS — E7800 Pure hypercholesterolemia, unspecified: Secondary | ICD-10-CM | POA: Diagnosis not present

## 2019-05-16 DIAGNOSIS — I10 Essential (primary) hypertension: Secondary | ICD-10-CM | POA: Diagnosis not present

## 2019-05-17 DIAGNOSIS — E1165 Type 2 diabetes mellitus with hyperglycemia: Secondary | ICD-10-CM | POA: Diagnosis not present

## 2019-05-17 DIAGNOSIS — S2242XD Multiple fractures of ribs, left side, subsequent encounter for fracture with routine healing: Secondary | ICD-10-CM | POA: Diagnosis not present

## 2019-05-17 DIAGNOSIS — Z7982 Long term (current) use of aspirin: Secondary | ICD-10-CM | POA: Diagnosis not present

## 2019-05-17 DIAGNOSIS — Z950 Presence of cardiac pacemaker: Secondary | ICD-10-CM | POA: Diagnosis not present

## 2019-05-17 DIAGNOSIS — I495 Sick sinus syndrome: Secondary | ICD-10-CM | POA: Diagnosis not present

## 2019-05-17 DIAGNOSIS — I10 Essential (primary) hypertension: Secondary | ICD-10-CM | POA: Diagnosis not present

## 2019-05-22 DIAGNOSIS — I10 Essential (primary) hypertension: Secondary | ICD-10-CM | POA: Diagnosis not present

## 2019-05-22 DIAGNOSIS — I495 Sick sinus syndrome: Secondary | ICD-10-CM | POA: Diagnosis not present

## 2019-05-22 DIAGNOSIS — Z950 Presence of cardiac pacemaker: Secondary | ICD-10-CM | POA: Diagnosis not present

## 2019-05-22 DIAGNOSIS — Z7982 Long term (current) use of aspirin: Secondary | ICD-10-CM | POA: Diagnosis not present

## 2019-05-22 DIAGNOSIS — E1165 Type 2 diabetes mellitus with hyperglycemia: Secondary | ICD-10-CM | POA: Diagnosis not present

## 2019-05-22 DIAGNOSIS — S2242XD Multiple fractures of ribs, left side, subsequent encounter for fracture with routine healing: Secondary | ICD-10-CM | POA: Diagnosis not present

## 2019-05-28 DIAGNOSIS — E1165 Type 2 diabetes mellitus with hyperglycemia: Secondary | ICD-10-CM | POA: Diagnosis not present

## 2019-05-28 DIAGNOSIS — I495 Sick sinus syndrome: Secondary | ICD-10-CM | POA: Diagnosis not present

## 2019-05-28 DIAGNOSIS — I10 Essential (primary) hypertension: Secondary | ICD-10-CM | POA: Diagnosis not present

## 2019-05-28 DIAGNOSIS — S2242XD Multiple fractures of ribs, left side, subsequent encounter for fracture with routine healing: Secondary | ICD-10-CM | POA: Diagnosis not present

## 2019-05-30 DIAGNOSIS — E1149 Type 2 diabetes mellitus with other diabetic neurological complication: Secondary | ICD-10-CM | POA: Diagnosis not present

## 2019-05-30 DIAGNOSIS — I10 Essential (primary) hypertension: Secondary | ICD-10-CM | POA: Diagnosis not present

## 2019-05-30 DIAGNOSIS — I2510 Atherosclerotic heart disease of native coronary artery without angina pectoris: Secondary | ICD-10-CM | POA: Diagnosis not present

## 2019-05-30 DIAGNOSIS — E7800 Pure hypercholesterolemia, unspecified: Secondary | ICD-10-CM | POA: Diagnosis not present

## 2019-06-18 DIAGNOSIS — Z23 Encounter for immunization: Secondary | ICD-10-CM | POA: Diagnosis not present

## 2019-07-02 DIAGNOSIS — H2512 Age-related nuclear cataract, left eye: Secondary | ICD-10-CM | POA: Diagnosis not present

## 2019-07-02 DIAGNOSIS — E119 Type 2 diabetes mellitus without complications: Secondary | ICD-10-CM | POA: Diagnosis not present

## 2019-07-02 DIAGNOSIS — H401133 Primary open-angle glaucoma, bilateral, severe stage: Secondary | ICD-10-CM | POA: Diagnosis not present

## 2019-07-16 DIAGNOSIS — E1165 Type 2 diabetes mellitus with hyperglycemia: Secondary | ICD-10-CM | POA: Diagnosis not present

## 2019-08-21 DIAGNOSIS — R001 Bradycardia, unspecified: Secondary | ICD-10-CM | POA: Diagnosis not present

## 2019-08-28 DIAGNOSIS — E7800 Pure hypercholesterolemia, unspecified: Secondary | ICD-10-CM | POA: Diagnosis not present

## 2019-08-28 DIAGNOSIS — I10 Essential (primary) hypertension: Secondary | ICD-10-CM | POA: Diagnosis not present

## 2019-08-28 DIAGNOSIS — E1165 Type 2 diabetes mellitus with hyperglycemia: Secondary | ICD-10-CM | POA: Diagnosis not present

## 2019-08-28 DIAGNOSIS — Z125 Encounter for screening for malignant neoplasm of prostate: Secondary | ICD-10-CM | POA: Diagnosis not present

## 2019-09-03 DIAGNOSIS — Z0000 Encounter for general adult medical examination without abnormal findings: Secondary | ICD-10-CM | POA: Diagnosis not present

## 2019-09-03 DIAGNOSIS — I2510 Atherosclerotic heart disease of native coronary artery without angina pectoris: Secondary | ICD-10-CM | POA: Diagnosis not present

## 2019-09-03 DIAGNOSIS — H6123 Impacted cerumen, bilateral: Secondary | ICD-10-CM | POA: Diagnosis not present

## 2019-09-03 DIAGNOSIS — Z8546 Personal history of malignant neoplasm of prostate: Secondary | ICD-10-CM | POA: Diagnosis not present

## 2019-09-03 DIAGNOSIS — E1165 Type 2 diabetes mellitus with hyperglycemia: Secondary | ICD-10-CM | POA: Diagnosis not present

## 2019-09-03 DIAGNOSIS — H539 Unspecified visual disturbance: Secondary | ICD-10-CM | POA: Diagnosis not present

## 2019-09-03 DIAGNOSIS — E7800 Pure hypercholesterolemia, unspecified: Secondary | ICD-10-CM | POA: Diagnosis not present

## 2019-10-01 ENCOUNTER — Other Ambulatory Visit: Payer: Self-pay | Admitting: Internal Medicine

## 2019-10-01 DIAGNOSIS — H539 Unspecified visual disturbance: Secondary | ICD-10-CM

## 2019-10-02 ENCOUNTER — Ambulatory Visit
Admission: RE | Admit: 2019-10-02 | Discharge: 2019-10-02 | Disposition: A | Discharge status: Home / Self Care | Payer: Medicare Other | Source: Ambulatory Visit | Attending: Internal Medicine | Admitting: Internal Medicine

## 2019-10-02 DIAGNOSIS — H539 Unspecified visual disturbance: Secondary | ICD-10-CM

## 2019-10-29 DIAGNOSIS — H2512 Age-related nuclear cataract, left eye: Secondary | ICD-10-CM | POA: Diagnosis not present

## 2019-10-29 DIAGNOSIS — H35351 Cystoid macular degeneration, right eye: Secondary | ICD-10-CM | POA: Diagnosis not present

## 2019-10-29 DIAGNOSIS — H401133 Primary open-angle glaucoma, bilateral, severe stage: Secondary | ICD-10-CM | POA: Diagnosis not present

## 2019-11-06 DIAGNOSIS — Z23 Encounter for immunization: Secondary | ICD-10-CM | POA: Diagnosis not present

## 2019-11-16 DIAGNOSIS — I10 Essential (primary) hypertension: Secondary | ICD-10-CM | POA: Diagnosis not present

## 2019-11-16 DIAGNOSIS — E1165 Type 2 diabetes mellitus with hyperglycemia: Secondary | ICD-10-CM | POA: Diagnosis not present

## 2019-12-05 DIAGNOSIS — Z23 Encounter for immunization: Secondary | ICD-10-CM | POA: Diagnosis not present

## 2019-12-11 DIAGNOSIS — L720 Epidermal cyst: Secondary | ICD-10-CM | POA: Diagnosis not present

## 2019-12-17 DIAGNOSIS — E1165 Type 2 diabetes mellitus with hyperglycemia: Secondary | ICD-10-CM | POA: Diagnosis not present

## 2019-12-25 DIAGNOSIS — I495 Sick sinus syndrome: Secondary | ICD-10-CM | POA: Diagnosis not present

## 2020-01-01 DIAGNOSIS — H35351 Cystoid macular degeneration, right eye: Secondary | ICD-10-CM | POA: Diagnosis not present

## 2020-01-01 DIAGNOSIS — H401133 Primary open-angle glaucoma, bilateral, severe stage: Secondary | ICD-10-CM | POA: Diagnosis not present

## 2020-01-28 DIAGNOSIS — E7800 Pure hypercholesterolemia, unspecified: Secondary | ICD-10-CM | POA: Diagnosis not present

## 2020-01-30 DIAGNOSIS — E1165 Type 2 diabetes mellitus with hyperglycemia: Secondary | ICD-10-CM | POA: Diagnosis not present

## 2020-01-30 DIAGNOSIS — F329 Major depressive disorder, single episode, unspecified: Secondary | ICD-10-CM | POA: Diagnosis not present

## 2020-01-30 DIAGNOSIS — R2689 Other abnormalities of gait and mobility: Secondary | ICD-10-CM | POA: Diagnosis not present

## 2020-01-30 DIAGNOSIS — E1149 Type 2 diabetes mellitus with other diabetic neurological complication: Secondary | ICD-10-CM | POA: Diagnosis not present

## 2020-01-30 DIAGNOSIS — G4733 Obstructive sleep apnea (adult) (pediatric): Secondary | ICD-10-CM | POA: Diagnosis not present

## 2020-01-30 DIAGNOSIS — Z7901 Long term (current) use of anticoagulants: Secondary | ICD-10-CM | POA: Diagnosis not present

## 2020-01-30 DIAGNOSIS — I2510 Atherosclerotic heart disease of native coronary artery without angina pectoris: Secondary | ICD-10-CM | POA: Diagnosis not present

## 2020-02-13 ENCOUNTER — Encounter: Payer: Self-pay | Admitting: Neurology

## 2020-02-13 ENCOUNTER — Ambulatory Visit (INDEPENDENT_AMBULATORY_CARE_PROVIDER_SITE_OTHER): Payer: Medicare Other | Admitting: Neurology

## 2020-02-13 ENCOUNTER — Other Ambulatory Visit: Payer: Self-pay

## 2020-02-13 VITALS — BP 123/68 | HR 86 | Temp 96.7°F | Ht 67.5 in | Wt 160.0 lb

## 2020-02-13 DIAGNOSIS — Z789 Other specified health status: Secondary | ICD-10-CM | POA: Diagnosis not present

## 2020-02-13 DIAGNOSIS — G4733 Obstructive sleep apnea (adult) (pediatric): Secondary | ICD-10-CM | POA: Diagnosis not present

## 2020-02-13 DIAGNOSIS — S299XXS Unspecified injury of thorax, sequela: Secondary | ICD-10-CM

## 2020-02-13 DIAGNOSIS — I495 Sick sinus syndrome: Secondary | ICD-10-CM

## 2020-02-13 DIAGNOSIS — Z950 Presence of cardiac pacemaker: Secondary | ICD-10-CM | POA: Diagnosis not present

## 2020-02-13 DIAGNOSIS — F0391 Unspecified dementia with behavioral disturbance: Secondary | ICD-10-CM | POA: Diagnosis not present

## 2020-02-13 DIAGNOSIS — Z9181 History of falling: Secondary | ICD-10-CM

## 2020-02-13 NOTE — Patient Instructions (Signed)
     1) patient's sleep apnea problem may not be his primary concern. I am happy to get him in for an attended sleep study.  Rule in/out OSA and also CSA in a neurodegenerative condition.   2) patient is unable to differentiate left/ right and fingers. Dementia ?.   3) visual loss - THIS PATIENT SHOULD NOT DRIVE !    My Plan is to proceed with:  1) PSG with expanded EEG montage - look close at heart arte , oxygen saturation.  2) MRI not able- pacemaker- CT not helpful.  3) will need visual field examination with Dr Lyndel Safe at [facility] eye center, visual field test.- 03-04-2020.
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
## 2020-02-19 ENCOUNTER — Ambulatory Visit (INDEPENDENT_AMBULATORY_CARE_PROVIDER_SITE_OTHER): Payer: Medicare Other | Admitting: Neurology

## 2020-02-19 DIAGNOSIS — G4733 Obstructive sleep apnea (adult) (pediatric): Secondary | ICD-10-CM

## 2020-02-19 DIAGNOSIS — G4737 Central sleep apnea in conditions classified elsewhere: Secondary | ICD-10-CM

## 2020-02-19 DIAGNOSIS — I495 Sick sinus syndrome: Secondary | ICD-10-CM

## 2020-02-19 DIAGNOSIS — S299XXS Unspecified injury of thorax, sequela: Secondary | ICD-10-CM

## 2020-02-19 DIAGNOSIS — Z950 Presence of cardiac pacemaker: Secondary | ICD-10-CM

## 2020-02-19 DIAGNOSIS — F0391 Unspecified dementia with behavioral disturbance: Secondary | ICD-10-CM

## 2020-02-19 DIAGNOSIS — Z789 Other specified health status: Secondary | ICD-10-CM

## 2020-02-19 DIAGNOSIS — Z9181 History of falling: Secondary | ICD-10-CM

## 2020-03-04 DIAGNOSIS — H401133 Primary open-angle glaucoma, bilateral, severe stage: Secondary | ICD-10-CM | POA: Diagnosis not present

## 2020-03-04 DIAGNOSIS — Z789 Other specified health status: Secondary | ICD-10-CM | POA: Insufficient documentation

## 2020-03-04 DIAGNOSIS — F03918 Unspecified dementia, unspecified severity, with other behavioral disturbance: Secondary | ICD-10-CM | POA: Insufficient documentation

## 2020-03-04 DIAGNOSIS — F0391 Unspecified dementia with behavioral disturbance: Secondary | ICD-10-CM | POA: Insufficient documentation

## 2020-03-04 DIAGNOSIS — H35351 Cystoid macular degeneration, right eye: Secondary | ICD-10-CM | POA: Diagnosis not present

## 2020-03-04 NOTE — Progress Notes (Signed)
IMPRESSION:  1. Central and Complex Sleep Apnea with a characteristic distribution of NREM apnea being dominant.  2. No hypoxemia of significance.  3. Paced cardiac rhythm     RECOMMENDATIONS:  1. Advise urgently a full night, attended, PAP titration study to optimize therapy.  Central sleep apnea may worsen under CPAP and therefore may require BiPAP or ASV intervention.

## 2020-03-04 NOTE — Procedures (Signed)
PATIENT'S NAME:  Andrew Wu, Andrew Wu DOB:      12-Dec-1940      MR#:    [id number]     DATE OF RECORDING: 02/19/2020 REFERRING M.D.:  Deland Pretty, MD Study Performed:   Baseline Polysomnogram HISTORY:  This 79 year -old patient of African- American and [place] descent was seen upon referral on 02/13/2020 from Dr. Viona Gilmore. [employer]. He had a HST in 2019 and diagnosed with sleep apnea but heart conditions took priority- in the meantime he has not been treated for SA. Chief concern according to patient: " I have untreated sleep apnea, have a pacemaker and can't sleep well, my memory may also be affected by poor sleep".    I have the pleasure of seeing [name]. , a right -handed  male with a medical history of Arthritis, Cardiac pacemaker in situ, Depressive disorder, not elsewhere classified, Presence of permanent cardiac pacemaker, Rib fractures (04/25/2019), Sinoatrial node dysfunction ([place]), Type II or unspecified type diabetes mellitus without mention of complication, not stated as uncontrolled, Unspecified essential hypertension, and Unspecified sleep apnea.  His pacemaker is a permanent implanted pacemaker without defibrillator function.  He received the device about 5 years ago.  His cardiac issues have been stable over the last 2 years.  His diabetes has been followed by Dr. Chalmers Cater. PT/ OT has improved some balance problems.    Sleep relevant medical history: Dreams are reportedly frequent. Vivid dreams.  Naps are taken after lunch frequently, lasting from 20-30 minutes and are more refreshing .   The patient endorsed the Epworth Sleepiness Scale at 11 points.   The patient's weight 163 pounds with a height of 67 (inches), resulting in a BMI of 25.6 kg/m2. The patient's neck circumference measured 15 inches.  CURRENT MEDICATIONS: Aspirin, Lipitor, Combigan, Zetia, Humalog, Xalatan   PROCEDURE:  This is a multichannel digital polysomnogram utilizing the Somnostar 11.2 system.  Electrodes and sensors were  applied and monitored per AASM Specifications.   EEG, EOG, Chin and Limb EMG, were sampled at 200 Hz.  ECG, Snore and Nasal Pressure, Thermal Airflow, Respiratory Effort, CPAP Flow and Pressure, Oximetry was sampled at 50 Hz. Digital video and audio were recorded.      BASELINE STUDY : Lights Out was at 20:52 and Lights On at 05:00.  Total recording time (TRT) was 488 minutes, with a total sleep time (TST) of 272 minutes.   The patient's sleep latency was 152.5 minutes.  REM latency was 219.5 minutes.  The sleep efficiency was 55.7 %.     SLEEP ARCHITECTURE: WASO (Wake after sleep onset) was 189.5 minutes.  There were 125.5 minutes in Stage N1, 99 minutes Stage N2, 9.5 minutes Stage N3 and 38 minutes in Stage REM.  The percentage of Stage N1 was 46.1%, Stage N2 was 36.4%, Stage N3 was 3.5% and Stage R (REM sleep) was 14.%.     RESPIRATORY ANALYSIS:  There were a total of 192 respiratory events:  0 obstructive apneas, 25 central apneas and 127 mixed apneas with a total of 152 apneas and an apnea index (AI) of 33.5 /hour. There were 40 hypopneas with a hypopnea index of 8.8 /hour.      The total APNEA/HYPOPNEA INDEX (AHI) was 42.4/hour 31 events occurred in REM sleep and 201 events in NREM. The REM AHI was 4.7 /hour, versus a non-REM AHI of 48.5. The patient spent 36.5 minutes of total sleep time in the supine position and 236 minutes in non-supine. The supine AHI  was 83.8/h versus a non-supine AHI of 35.9/h.  OXYGEN SATURATION & C02:  The Wake baseline 02 saturation was 92%, with the lowest being 86%. Time spent below 89% saturation equaled 2 minutes. The arousals were noted as: 39 were spontaneous, 0 were associated with PLMs, 137 were associated with respiratory events. The patient had a total of 0 Periodic Limb Movements  Audio and video analysis did not show any abnormal or unusual movements, behaviors, phonations or vocalizations.  EKG was in paced rhythm.    IMPRESSION:  1. Central and  Complex Sleep Apnea with a characteristic distribution of NREM apnea being dominant.  2. No hypoxemia of significance.  3. Paced cardiac rhythm     RECOMMENDATIONS:  1. Advise urgently a full night, attended, PAP titration study to optimize therapy.  Central sleep apnea may worsen under CPAP and therefore may require BiPAP or ASV intervention.    I certify that I have reviewed the entire raw data recording prior to the issuance of this report in accordance with the Standards of Accreditation of the American Academy of Sleep Medicine (AASM)   Larey Seat, MD Diplomat, American Board of Psychiatry and Neurology  Diplomat, American Board of Sleep Medicine Market researcher, [facility] [employer]

## 2020-03-04 NOTE — Addendum Note (Signed)
Addended by: Larey Seat on: 03/04/2020 12:26 PM   Modules accepted: Orders

## 2020-03-05 ENCOUNTER — Telehealth: Payer: Self-pay | Admitting: *Deleted

## 2020-03-05 NOTE — Telephone Encounter (Signed)
I called the pt's wife Iris (on DPR) and LVM (ok per DPR) asking for call back to discuss the pt's sleep study results. I let her know it did show complex and central sleep apnea and Dr. Brett Fairy advises pt come back for another sleep study. I asked for a call back asap to discuss this. Office number and hours left in message.   ---------------------------- IMPRESSION:  1. Central and Complex Sleep Apnea with a characteristic distribution of NREM apnea being dominant.  2. No hypoxemia of significance.  3. Paced cardiac rhythm    RECOMMENDATIONS:  1. Advise urgently a full night, attended, PAP titration study to optimize therapy. Central sleep apnea may worsen under CPAP and therefore may require BiPAP or ASV intervention.

## 2020-03-06 ENCOUNTER — Other Ambulatory Visit: Payer: Self-pay | Admitting: *Deleted

## 2020-03-06 DIAGNOSIS — G4733 Obstructive sleep apnea (adult) (pediatric): Secondary | ICD-10-CM

## 2020-03-06 NOTE — Telephone Encounter (Signed)
Spoke with pt and his wife and discussed sleep study results. Wife's questions were answered. She asked that the sleep team call her to schedule the sleep study as soon as possible.

## 2020-03-20 ENCOUNTER — Other Ambulatory Visit: Payer: Self-pay

## 2020-03-20 ENCOUNTER — Ambulatory Visit (INDEPENDENT_AMBULATORY_CARE_PROVIDER_SITE_OTHER): Payer: Medicare Other | Admitting: Neurology

## 2020-03-20 DIAGNOSIS — G4737 Central sleep apnea in conditions classified elsewhere: Secondary | ICD-10-CM

## 2020-03-20 DIAGNOSIS — F0391 Unspecified dementia with behavioral disturbance: Secondary | ICD-10-CM

## 2020-03-20 DIAGNOSIS — Z789 Other specified health status: Secondary | ICD-10-CM

## 2020-03-20 DIAGNOSIS — G4731 Primary central sleep apnea: Secondary | ICD-10-CM | POA: Diagnosis not present

## 2020-03-20 DIAGNOSIS — Z950 Presence of cardiac pacemaker: Secondary | ICD-10-CM

## 2020-03-31 DIAGNOSIS — G4737 Central sleep apnea in conditions classified elsewhere: Secondary | ICD-10-CM | POA: Insufficient documentation

## 2020-03-31 NOTE — Procedures (Signed)
PATIENT'S NAME:  Andrew Wu, Andrew Wu DOB:      1941-02-16      MR#:    [id number]     DATE OF RECORDING: 03/20/2020 REFERRING M.D.:  Jacelyn Pi, MD Study Performed:   Titration to Positive Airway pressure/ BiPAP HISTORY:  This 79 year -old patient of African- American and [place] descent was seen upon referral on 02/13/2020 from Dr. Audie Pinto. He had a HST in 2019 and was diagnosed with sleep apnea but heart conditions took priority- in the meantime he has not been treated for SA. Chief concern according to patient: " I have untreated sleep apnea, have a pacemaker and can't sleep well, my memory may also be affected by poor sleep".   Diagnostic Polysomnogram performed on 02/19/2020 revealed: Central and Complex Sleep Apnea at AHI of 42/h with a characteristic distribution of NREM apnea being dominant. No hypoxemia of significance. Paced cardiac rhythm     The patient endorsed the Epworth Sleepiness Scale at 11 points.   The patient's weight 163 pounds with a height of 67 (inches), resulting in a BMI of 25.6 kg/m2. The patient's neck circumference measured 15 inches.    CURRENT MEDICATIONS: Aspirin, Lipitor, Combigan, Zetia, Humalog, Xalatan    PROCEDURE:  This is a multichannel digital polysomnogram utilizing the SomnoStar 11.2 system.  Electrodes and sensors were applied and monitored per AASM Specifications.   EEG, EOG, Chin and Limb EMG, were sampled at 200 Hz.  ECG, Snore and Nasal Pressure, Thermal Airflow, Respiratory Effort, CPAP Flow and Pressure, Oximetry was sampled at 50 Hz. Digital video and audio were recorded.       The patient was fitted with a Small Simplus FFM mask. CPAP was initiated at 5 cmH20 with heated humidity per AASM split night standards and pressure was advanced to CPAP at 12 cm water but failed to control OSA and CSA. Switched to BiPAP at 15/10 cm water and from there on to BiPAP ST 16/11/cmH20, BUR = 10 because of hypopneas, central apneas and desaturations.  At a BiPAP  pressure of 16/11 cmH20 with an ST 10 , there was a reduction of the AHI to 0 with improvement of sleep apnea.  Lights Out was at 20:45 and Lights On at 04:09. Total recording time (TRT) was 445 minutes, with a total sleep time (TST) of 280 minutes. The patient's sleep latency was 62 minutes. REM latency was 118.5 minutes.  The sleep efficiency was 62.9 %.    SLEEP ARCHITECTURE: WASO (Wake after sleep onset) was 115 minutes.  There were 54 minutes in Stage N1, 182 minutes Stage N2, 4 minutes Stage N3 and 40 minutes in Stage REM.  The percentage of Stage N1 was 19.3%, Stage N2 was 65.%, Stage N3 was 1.4% and Stage R (REM sleep) was 14.3%.      RESPIRATORY ANALYSIS:  There was a total of 69 respiratory events: 15 obstructive apneas, 14 central apneas and 15 mixed apneas with a total of 44 apneas and an apnea index (AHI) of 9.4 /hour. There were 25 hypopneas with a hypopnea index of 5.4/hour. The patient also had 0 respiratory event related arousals (RERAs).      The total APNEA/HYPOPNEA INDEX  (AHI) was 14.8 /hour and the total RESPIRATORY DISTURBANCE INDEX was 14.8 /hour  3 events occurred in REM sleep and 66 events in NREM. The REM AHI was 4.5 /hour versus a non-REM AHI of 16.5 /hour.  The patient spent 5.5 minutes of total sleep time in the supine  position and 275 minutes in non-supine. The supine AHI was 32.7, versus a non-supine AHI of 14.5.  OXYGEN SATURATION & C02:  The baseline 02 saturation was 96%, with the lowest being 89%. Time spent below 89% saturation equaled 0 minutes.  The arousals were noted as: 88 were spontaneous, 0 were associated with PLMs, 41 were associated with respiratory events. The patient had a total of 0 Periodic Limb Movements. The Periodic Limb Movement (PLM) index was 0 and the PLM Arousal index was 0 /hour. EKG was in keeping with sinus rhythm and many PVCs - see screen shot.   DIAGNOSIS 1. Central Sleep Apnea responded to BiPAP ST at 16/11 cm water, 10 ST with a  very short sleep time of 5 minutes only- a FFM was used, Simplus small size.  2. No Sleep Related Hypoxemia 3. No Periodic Limb Movement Disorder  4. Non-specific abnormal EKG   PLANS/RECOMMENDATIONS: I ordered BiPAP ST with above settings and mask for this patient.  1. CPAP therapy complicate is defined as 4 hours or more with nightly use.  2. Any apnea patient should avoid sedatives, hypnotics, and alcohol consumption.  A follow up appointment will be scheduled in the Sleep Clinic at [facility].   Please call [phone] with any questions.      I certify that I have reviewed the entire raw data recording prior to the issuance of this report in accordance with the Standards of Accreditation of the American Academy of Sleep Medicine (AASM)  Larey Seat, M.D. Diplomat, Tax adviser of Psychiatry and Neurology  Diplomat, Tax adviser of Sleep Medicine Market researcher, [facility] [employer]

## 2020-03-31 NOTE — Addendum Note (Signed)
Addended by: Larey Seat on: 03/31/2020 05:01 PM   Modules accepted: Orders

## 2020-04-01 ENCOUNTER — Encounter: Payer: Self-pay | Admitting: Neurology

## 2020-04-01 ENCOUNTER — Telehealth: Payer: Self-pay | Admitting: Neurology

## 2020-04-01 NOTE — Telephone Encounter (Signed)
-----   Message from Larey Seat, MD sent at 03/31/2020  5:01 PM EDT ----- DIAGNOSIS  1. Central Sleep Apnea responded to BiPAP ST at 16/11 cm water,  10 ST with a very short sleep time of 5 minutes only- a FFM was  used, Simplus small size.  2. No Sleep Related Hypoxemia  3. No Periodic Limb Movement Disorder  4. Non-specific abnormal EKG    PLANS/RECOMMENDATIONS: I ordered BiPAP ST with above settings and  mask for this patient.  1. BiPAP therapy complicate is defined as 4 hours or more with  nightly use.  2. Any apnea patient should avoid sedatives, hypnotics, and  alcohol consumption.

## 2020-04-01 NOTE — Telephone Encounter (Signed)
I called pt. I advised pt that Dr. Brett Fairy reviewed their sleep study results and found that pt was best treated under BiPAP . Dr. Brett Fairy recommends that pt starts BiPAP ST. I reviewed PAP compliance expectations with the pt. Pt is agreeable to starting a CPAP. I advised pt that an order will be sent to a DME, [facility], and [facility] will call the pt within about one week after they file with the pt's insurance. [facility] will show the pt how to use the machine, fit for masks, and troubleshoot the CPAP if needed. A follow up appt was made for insurance purposes with Debbora Presto, NP on Sept 30,2021 at 7:45 am. Pt verbalized understanding to arrive 15 minutes early and bring their CPAP. A letter with all of this information in it will be mailed to the pt as a reminder. I verified with the pt that the address we have on file is correct. Pt verbalized understanding of results. Pt had no questions at this time but was encouraged to call back if questions arise. I have sent the order to [facility] and have received confirmation that they have received the order.

## 2020-04-30 DIAGNOSIS — R748 Abnormal levels of other serum enzymes: Secondary | ICD-10-CM | POA: Diagnosis not present

## 2020-04-30 DIAGNOSIS — E1165 Type 2 diabetes mellitus with hyperglycemia: Secondary | ICD-10-CM | POA: Diagnosis not present

## 2020-05-06 DIAGNOSIS — R748 Abnormal levels of other serum enzymes: Secondary | ICD-10-CM | POA: Diagnosis not present

## 2020-05-06 DIAGNOSIS — E1165 Type 2 diabetes mellitus with hyperglycemia: Secondary | ICD-10-CM | POA: Diagnosis not present

## 2020-06-10 DIAGNOSIS — H35351 Cystoid macular degeneration, right eye: Secondary | ICD-10-CM | POA: Diagnosis not present

## 2020-06-10 DIAGNOSIS — H401133 Primary open-angle glaucoma, bilateral, severe stage: Secondary | ICD-10-CM | POA: Diagnosis not present

## 2020-06-10 DIAGNOSIS — E119 Type 2 diabetes mellitus without complications: Secondary | ICD-10-CM | POA: Diagnosis not present

## 2020-06-24 DIAGNOSIS — I495 Sick sinus syndrome: Secondary | ICD-10-CM | POA: Diagnosis not present

## 2020-07-03 ENCOUNTER — Encounter: Payer: Self-pay | Admitting: Family Medicine

## 2020-07-03 ENCOUNTER — Other Ambulatory Visit: Payer: Self-pay

## 2020-07-03 ENCOUNTER — Ambulatory Visit (INDEPENDENT_AMBULATORY_CARE_PROVIDER_SITE_OTHER): Payer: Medicare Other | Admitting: Family Medicine

## 2020-07-03 VITALS — BP 116/65 | HR 70 | Ht 66.0 in | Wt 161.0 lb

## 2020-07-03 DIAGNOSIS — G4730 Sleep apnea, unspecified: Secondary | ICD-10-CM

## 2020-07-03 DIAGNOSIS — R413 Other amnesia: Secondary | ICD-10-CM | POA: Diagnosis not present

## 2020-07-03 DIAGNOSIS — G4731 Primary central sleep apnea: Secondary | ICD-10-CM

## 2020-07-03 NOTE — Patient Instructions (Signed)
Please continue using your BiPAP regularly. While your insurance requires that you use BiPAP at least 4 hours each night on 70% of the nights, I recommend, that you not skip any nights and use it throughout the night if you can. Getting used to BiPAP and staying with the treatment long term does take time and patience and discipline. Untreated obstructive sleep apnea when it is moderate to severe can have an adverse impact on cardiovascular health and raise her risk for heart disease, arrhythmias, hypertension, congestive heart failure, stroke and diabetes. Untreated obstructive sleep apnea causes sleep disruption, nonrestorative sleep, and sleep deprivation. This can have an impact on your day to day functioning and cause daytime sleepiness and impairment of cognitive function, memory loss, mood disturbance, and problems focussing. Using BiPAP regularly can improve these symptoms.   Follow up in 6 months    Sleep Apnea Sleep apnea affects breathing during sleep. It causes breathing to stop for a short time or to become shallow. It can also increase the risk of:  Heart attack.  Stroke.  Being very overweight (obese).  Diabetes.  Heart failure.  Irregular heartbeat. The goal of treatment is to help you breathe normally again. What are the causes? There are three kinds of sleep apnea:  Obstructive sleep apnea. This is caused by a blocked or collapsed airway.  Central sleep apnea. This happens when the brain does not send the right signals to the muscles that control breathing.  Mixed sleep apnea. This is a combination of obstructive and central sleep apnea. The most common cause of this condition is a collapsed or blocked airway. This can happen if:  Your throat muscles are too relaxed.  Your tongue and tonsils are too large.  You are overweight.  Your airway is too small. What increases the risk?  Being overweight.  Smoking.  Having a small airway.  Being  older.  Being male.  Drinking alcohol.  Taking medicines to calm yourself (sedatives or tranquilizers).  Having family members with the condition. What are the signs or symptoms?  Trouble staying asleep.  Being sleepy or tired during the day.  Getting angry a lot.  Loud snoring.  Headaches in the morning.  Not being able to focus your mind (concentrate).  Forgetting things.  Less interest in sex.  Mood swings.  Personality changes.  Feelings of sadness (depression).  Waking up a lot during the night to pee (urinate).  Dry mouth.  Sore throat. How is this diagnosed?  Your medical history.  A physical exam.  A test that is done when you are sleeping (sleep study). The test is most often done in a sleep lab but may also be done at home. How is this treated?   Sleeping on your side.  Using a medicine to get rid of mucus in your nose (decongestant).  Avoiding the use of alcohol, medicines to help you relax, or certain pain medicines (narcotics).  Losing weight, if needed.  Changing your diet.  Not smoking.  Using a machine to open your airway while you sleep, such as: ? An oral appliance. This is a mouthpiece that shifts your lower jaw forward. ? A CPAP device. This device blows air through a mask when you breathe out (exhale). ? An EPAP device. This has valves that you put in each nostril. ? A BPAP device. This device blows air through a mask when you breathe in (inhale) and breathe out.  Having surgery if other treatments do not work. It   is important to get treatment for sleep apnea. Without treatment, it can lead to:  High blood pressure.  Coronary artery disease.  In men, not being able to have an erection (impotence).  Reduced thinking ability. Follow these instructions at home: Lifestyle  Make changes that your doctor recommends.  Eat a healthy diet.  Lose weight if needed.  Avoid alcohol, medicines to help you relax, and some pain  medicines.  Do not use any products that contain nicotine or tobacco, such as cigarettes, e-cigarettes, and chewing tobacco. If you need help quitting, ask your doctor. General instructions  Take over-the-counter and prescription medicines only as told by your doctor.  If you were given a machine to use while you sleep, use it only as told by your doctor.  If you are having surgery, make sure to tell your doctor you have sleep apnea. You may need to bring your device with you.  Keep all follow-up visits as told by your doctor. This is important. Contact a doctor if:  The machine that you were given to use during sleep bothers you or does not seem to be working.  You do not get better.  You get worse. Get help right away if:  Your chest hurts.  You have trouble breathing in enough air.  You have an uncomfortable feeling in your back, arms, or stomach.  You have trouble talking.  One side of your body feels weak.  A part of your face is hanging down. These symptoms may be an emergency. Do not wait to see if the symptoms will go away. Get medical help right away. Call your local emergency services (911 in the [place] Do not drive yourself to the hospital. Summary  This condition affects breathing during sleep.  The most common cause is a collapsed or blocked airway.  The goal of treatment is to help you breathe normally while you sleep. This information is not intended to replace advice given to you by your health care provider. Make sure you discuss any questions you have with your health care provider. Document Revised: 07/07/2018 Document Reviewed: 05/16/2018 Elsevier Patient Education  2020 [employer]  

## 2020-07-03 NOTE — Progress Notes (Signed)
PATIENT: Andrew Wu. DOB: [birth year]  REASON FOR VISIT: follow up HISTORY FROM: patient  Chief Complaint  Patient presents with   Follow-up    rm 1   Sleep Apnea   Memory Loss     HISTORY OF PRESENT ILLNESS: Today 07/03/20 Andrew Wu. is a 79 y.o. male here today for follow up for complex sleep apnea started on BiPAP therapy. Sleep study on 02/19/2020 showed "Central and Complex Sleep Apnea at AHI of 42/h with a characteristic distribution of NREM apnea being dominant. No hypoxemia of significance. Paced cardiac rhythm." He continues to adjust to BiPAP therapy.  He is uncertain if he has noted any specific benefit in using BiPAP, however, his wife mentions that he is much more alert after using BiPAP.  He does not nap as much during the day.  He is now using a fullface mask and feels that this is more comfortable.  He feels that memory is stable.  He does not drive.  He is able to assist with managing his home.  He is followed closely by ophthalmology.  Compliance report dated 06/03/2020 through 07/02/2020 reveals that he used BiPAP 26 of the past 30 days for compliance of 87%.  He used BiPAP greater than 4 hours 19 of the past 30 days for compliance of 63%.  Average usage on days used was 5 hours and 2 minutes.  Residual AHI was 4.2 with IPAP of 16 cm of water and EPAP of 11 cm of water, respiratory rate of 10.  There was no significant leak noted.  HISTORY: (copied from Dr Dohmeier's note on 02/13/2020)  Andrew Wu.is a 79 y.o.  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 Shelia Media.  Chiefconcernaccording to patient : "I have sleep apnea, a pacemaker and can't sleep well, my memory may also be affected" [place]   I have the pleasure of seeing Andrew Wu. today,a right -handed Black or African American male with a possible sleep disorder. he   has a past medical history of  Arthritis, Cardiac pacemaker in situ, Depressive disorder, not elsewhere classified, Presence of permanent cardiac pacemaker, Rib fractures (04/25/2019), Sinoatrial node dysfunction ([place]), Type II or unspecified type diabetes mellitus without mention of complication, not stated as uncontrolled, Unspecified essential hypertension, and Unspecified sleep apnea.  His pacemaker is a permanent implanted pacemaker without defibrillator function.  He received the device about 5 years ago.  His cardiac issues have been stable over the last 2 years.   His diabetes has been followed by Dr. Chalmers Cater. PT/ OT has noted some balance problems.    Sleeprelevant medical history:  Nocturia ; 1-2 times, no Tonsillectomy, rib cage injuries and whiplash to cervical spine, glaucoma surgery." Foggy mind ".  Social history:Patient is retired from [employer] and lives in a household with spouse, sometimes grandchildren stay over. His wife's aunt lives with them. Pets are not present. Tobacco use- 2000. ETOH use - none for 50 years,  Caffeine intake in form of Coffee(1-2 ) Soda( occasional ) Tea ( none ) , no energy drinks. Regular exercise- none      Sleep habits are as follows:The patient's dinner time is between 6- 6.30 PM. He falls asleep while watching TV- The patient goes to bed at 9.30 PM and continues to sleep for 4 hours, wakes for one bathroom breaks, the first time at 3-4 AM.  The preferred sleep position is right side, with the support of one pillow.  Dreams are reportedly frequent. Vivid dreams.  6.30  AM is the usual rise time. The patient wakes up with his wife's alarm at 6.00.[name]  He reports not feeling refreshed or restored in AM, with symptoms such as dry mouth, no morning headaches and residual fatigue.  Naps are taken after lunch frequently, lasting from 20-30  minutes and are more refreshing .   REVIEW OF SYSTEMS: Out of a complete 14 system review of symptoms, the patient complains only  of the following symptoms, vision loss, memory loss, frequent urination and all other reviewed systems are negative.  ESS: 13 FSS: 30  ALLERGIES: Allergies  Allergen Reactions   Lisinopril Swelling    Facial swelling     Amoxicillin Other (See Comments)    GI Upset   Losartan Swelling   Metformin And Related Other (See Comments)    GI upset   Asa [Aspirin] Other (See Comments)    Stomach hurts - is able to take enteric coated aspirin    HOME MEDICATIONS: Outpatient Medications Prior to Visit  Medication Sig Dispense Refill   aspirin EC 81 MG tablet Take 81 mg by mouth daily.     atorvastatin (LIPITOR) 20 MG tablet Take 20 mg by mouth at bedtime.      COMBIGAN 0.2-0.5 % ophthalmic solution Place 1 drop into the left eye 2 (two) times daily.      insulin degludec (TRESIBA FLEXTOUCH) 100 UNIT/ML SOPN FlexTouch Pen Inject 18 Units into the skin at bedtime.      insulin lispro (HUMALOG) 100 UNIT/ML injection Inject 10 Units into the skin 3 (three) times daily before meals. 10 unit every am, lunch and 8 U evening     latanoprost (XALATAN) 0.005 % ophthalmic solution Place 1 drop into both eyes at bedtime.     ezetimibe (ZETIA) 10 MG tablet Take 10 mg by mouth daily.     No facility-administered medications prior to visit.    PAST MEDICAL HISTORY: Past Medical History:  Diagnosis Date   Arthritis    Cardiac pacemaker in situ    Depressive disorder, not elsewhere classified    Presence of permanent cardiac pacemaker    Rib fractures 04/25/2019   FROM FALL AT HOME    Sinoatrial node dysfunction (HCC)    Type II or unspecified type diabetes mellitus without mention of complication, not stated as uncontrolled    Unspecified essential hypertension    Unspecified sleep apnea     PAST SURGICAL HISTORY: Past Surgical History:  Procedure Laterality Date   APPENDECTOMY     HERNIA REPAIR     INSERT / REPLACE / REMOVE PACEMAKER  2006 ?    pacemaker   02/23/08   medtronic Adapta-sinus node dysfunction    PROSTATE SURGERY     SEED IMPLANT    FAMILY HISTORY: No family history on file.  SOCIAL HISTORY: Social History   Socioeconomic History   Marital status: Married    Spouse name: Not on file   Number of children: Not on file   Years of education: Not on file   Highest education level: Not on file  Occupational History   Not on file  Tobacco Use   Smoking status: Former Smoker    Types: Cigarettes   Smokeless tobacco: Never Used  Vaping Use   Vaping Use: Never used  Substance and Sexual Activity   Alcohol use: No   Drug use:  No   Sexual activity: Not on file  Other Topics Concern   Not on file  Social History Narrative   Certified letter sent, not doing follow ups, returned signed by pt. No apts made 10/16/09   Social Determinants of Health   Financial Resource Strain:    Difficulty of Paying Living Expenses: Not on file  Food Insecurity:    Worried About Charity fundraiser in the Last Year: Not on file   [employer] of Food in the Last Year: Not on file  Transportation Needs:    Lack of Transportation (Medical): Not on file   Lack of Transportation (Non-Medical): Not on file  Physical Activity:    Days of Exercise per Week: Not on file   Minutes of Exercise per Session: Not on file  Stress:    Feeling of Stress : Not on file  Social Connections:    Frequency of Communication with Friends and Family: Not on file   Frequency of Social Gatherings with Friends and Family: Not on file   Attends Religious Services: Not on file   Active Member of Clubs or Organizations: Not on file   Attends Archivist Meetings: Not on file   Marital Status: Not on file  Intimate Partner Violence:    Fear of Current or Ex-Partner: Not on file   Emotionally Abused: Not on file   Physically Abused: Not on file   Sexually Abused: Not on file      PHYSICAL EXAM  Vitals:   07/03/20 0746   BP: 116/65  Pulse: 70  Weight: 161 lb (73 kg)  Height: 5\' 6"  (1.676 m)   Body mass index is 25.99 kg/m.  Generalized: Well developed, in no acute distress  Cardiology: normal rate and rhythm, no murmur noted Respiratory: clear to auscultation bilaterally  Neurological examination  Mentation: Alert oriented to time, place, history taking. Follows all commands speech and language fluent Motor: The motor testing reveals 5 over 5 strength of all 4 extremities. Good symmetric motor tone is noted throughout.  Gait and station: Gait is normal.    DIAGNOSTIC DATA (LABS, IMAGING, TESTING) - I reviewed patient records, labs, notes, testing and imaging myself where available.  MMSE - Mini Mental State Exam 07/03/2020  Orientation to time 3  Orientation to Place 4  Registration 3  Attention/ Calculation 5  Recall 2  Language- name 2 objects 2  Language- repeat 1  Language- follow 3 step command 2  Language- read & follow direction 1  Write a sentence 1  Copy design 1  Total score 25     Lab Results  Component Value Date   WBC 8.6 04/26/2019   HGB 13.2 04/26/2019   HCT 41.1 04/26/2019   MCV 84.2 04/26/2019   PLT 147 (L) 04/26/2019      Component Value Date/Time   NA 139 04/26/2019 0306   K 4.2 04/26/2019 0306   CL 106 04/26/2019 0306   CO2 23 04/26/2019 0306   GLUCOSE 213 (H) 04/26/2019 0306   BUN 20 04/26/2019 0306   CREATININE 1.01 04/26/2019 0306   CALCIUM 9.6 04/26/2019 0306   PROT 6.4 (L) 04/25/2019 1004   ALBUMIN 3.9 04/25/2019 1004   AST 25 04/25/2019 1004   ALT 29 04/25/2019 1004   ALKPHOS 68 04/25/2019 1004   BILITOT 1.3 (H) 04/25/2019 1004   GFRNONAA >60 04/26/2019 0306   GFRAA >60 04/26/2019 0306   No results found for: CHOL, HDL, LDLCALC, LDLDIRECT, TRIG, CHOLHDL  Lab Results  Component Value Date   HGBA1C 9.1 (H) 04/25/2019   No results found for: VITAMINB12 No results found for: TSH     ASSESSMENT AND PLAN 79 y.o. year old male  has a past  medical history of Arthritis, Cardiac pacemaker in situ, Depressive disorder, not elsewhere classified, Presence of permanent cardiac pacemaker, Rib fractures (04/25/2019), Sinoatrial node dysfunction ([place]), Type II or unspecified type diabetes mellitus without mention of complication, not stated as uncontrolled, Unspecified essential hypertension, and Unspecified sleep apnea. here with     ICD-10-CM   1. Complex sleep apnea syndrome  G47.31   2. Sleep apnea treated with nocturnal BiPAP  G47.30   3. Memory loss  R41.3     Mr. Fulgham continues to adjust to BiPAP therapy.  Compliance report reveals acceptable daily compliance with suboptimal 4-hour compliance.  His wife does note that he is more alert during the day after using BiPAP therapy.  He is motivated to continue with therapy.  He was encouraged to use BiPAP nightly and for greater than 4 hours each night.  MMSE 25 of 30.  He feels that his memory is stable.  He does not drive.  He was encouraged to continue regular physical and mental activity.  Memory compensation strategies advised.  He will follow-up closely with primary care and ophthalmology.  He will return to see me in 6 months, sooner if needed.  He verbalizes understanding and agreement with this plan.   No orders of the defined types were placed in this encounter.    No orders of the defined types were placed in this encounter.     I spent 30 minutes with the patient. 50% of this time was spent counseling and educating patient on plan of care and medications.     Debbora Presto, FNP-C 07/03/2020, 8:33 AM [facility] [address] [place], [place] [address] [phone]

## 2020-07-04 DIAGNOSIS — Z23 Encounter for immunization: Secondary | ICD-10-CM | POA: Diagnosis not present

## 2020-09-01 DIAGNOSIS — Z0000 Encounter for general adult medical examination without abnormal findings: Secondary | ICD-10-CM | POA: Diagnosis not present

## 2020-09-01 DIAGNOSIS — I10 Essential (primary) hypertension: Secondary | ICD-10-CM | POA: Diagnosis not present

## 2020-09-01 DIAGNOSIS — E1165 Type 2 diabetes mellitus with hyperglycemia: Secondary | ICD-10-CM | POA: Diagnosis not present

## 2020-09-01 DIAGNOSIS — E1149 Type 2 diabetes mellitus with other diabetic neurological complication: Secondary | ICD-10-CM | POA: Diagnosis not present

## 2020-09-01 DIAGNOSIS — E559 Vitamin D deficiency, unspecified: Secondary | ICD-10-CM | POA: Diagnosis not present

## 2020-09-01 DIAGNOSIS — Z125 Encounter for screening for malignant neoplasm of prostate: Secondary | ICD-10-CM | POA: Diagnosis not present

## 2020-09-01 DIAGNOSIS — E7800 Pure hypercholesterolemia, unspecified: Secondary | ICD-10-CM | POA: Diagnosis not present

## 2020-09-09 DIAGNOSIS — Z23 Encounter for immunization: Secondary | ICD-10-CM | POA: Diagnosis not present

## 2020-09-09 DIAGNOSIS — G4733 Obstructive sleep apnea (adult) (pediatric): Secondary | ICD-10-CM | POA: Diagnosis not present

## 2020-09-09 DIAGNOSIS — I2510 Atherosclerotic heart disease of native coronary artery without angina pectoris: Secondary | ICD-10-CM | POA: Diagnosis not present

## 2020-09-09 DIAGNOSIS — I10 Essential (primary) hypertension: Secondary | ICD-10-CM | POA: Diagnosis not present

## 2020-09-09 DIAGNOSIS — F329 Major depressive disorder, single episode, unspecified: Secondary | ICD-10-CM | POA: Diagnosis not present

## 2020-09-09 DIAGNOSIS — E1149 Type 2 diabetes mellitus with other diabetic neurological complication: Secondary | ICD-10-CM | POA: Diagnosis not present

## 2020-09-09 DIAGNOSIS — H401133 Primary open-angle glaucoma, bilateral, severe stage: Secondary | ICD-10-CM | POA: Diagnosis not present

## 2020-09-09 DIAGNOSIS — Z0001 Encounter for general adult medical examination with abnormal findings: Secondary | ICD-10-CM | POA: Diagnosis not present

## 2020-09-09 DIAGNOSIS — Z8546 Personal history of malignant neoplasm of prostate: Secondary | ICD-10-CM | POA: Diagnosis not present

## 2020-09-09 DIAGNOSIS — G250 Essential tremor: Secondary | ICD-10-CM | POA: Diagnosis not present

## 2020-09-11 DIAGNOSIS — I208 Other forms of angina pectoris: Secondary | ICD-10-CM | POA: Diagnosis not present

## 2020-09-11 DIAGNOSIS — I10 Essential (primary) hypertension: Secondary | ICD-10-CM | POA: Diagnosis not present

## 2020-09-11 DIAGNOSIS — Z950 Presence of cardiac pacemaker: Secondary | ICD-10-CM | POA: Diagnosis not present

## 2020-09-11 DIAGNOSIS — E119 Type 2 diabetes mellitus without complications: Secondary | ICD-10-CM | POA: Diagnosis not present

## 2020-09-11 DIAGNOSIS — I2510 Atherosclerotic heart disease of native coronary artery without angina pectoris: Secondary | ICD-10-CM | POA: Diagnosis not present

## 2020-09-11 DIAGNOSIS — G4733 Obstructive sleep apnea (adult) (pediatric): Secondary | ICD-10-CM | POA: Diagnosis not present

## 2020-09-11 DIAGNOSIS — I495 Sick sinus syndrome: Secondary | ICD-10-CM | POA: Diagnosis not present

## 2020-09-11 DIAGNOSIS — E782 Mixed hyperlipidemia: Secondary | ICD-10-CM | POA: Diagnosis not present

## 2020-10-14 DIAGNOSIS — I495 Sick sinus syndrome: Secondary | ICD-10-CM | POA: Diagnosis not present

## 2020-12-09 DIAGNOSIS — H2512 Age-related nuclear cataract, left eye: Secondary | ICD-10-CM | POA: Diagnosis not present

## 2020-12-09 DIAGNOSIS — H35351 Cystoid macular degeneration, right eye: Secondary | ICD-10-CM | POA: Diagnosis not present

## 2020-12-09 DIAGNOSIS — H401133 Primary open-angle glaucoma, bilateral, severe stage: Secondary | ICD-10-CM | POA: Diagnosis not present

## 2020-12-09 DIAGNOSIS — E1149 Type 2 diabetes mellitus with other diabetic neurological complication: Secondary | ICD-10-CM | POA: Diagnosis not present

## 2020-12-09 DIAGNOSIS — E119 Type 2 diabetes mellitus without complications: Secondary | ICD-10-CM | POA: Diagnosis not present

## 2020-12-25 NOTE — Progress Notes (Signed)
PATIENT: Andrew Wu. DOB: [birth year]  REASON FOR VISIT: follow up HISTORY FROM: patient  Chief Complaint  Patient presents with  . Follow-up    Rm 1 with wife (iris) Pt is well, CPAP is helping but wife states he needs another fitting, mask too tight      HISTORY OF PRESENT ILLNESS: 12/29/20 ALL:  Mr Andrew Wu returns for BiPAP follow up. He continues to use BiPAP but admits that compliance has not been as good, recently. He reports that his current full face mask is too small. He feels that it is too tight and uncomfortable. Mis wife states that he is doing fairly well. Memory seems stable. He does not drive due to impaired vision. He is able to perform ADLs and can assist with financial needs and maintenance around the home. He does sleep better with BiPAP. They have changed to a DME company in [place]. Wife is uncertain of name.       07/03/2020 ALL:  Andrew Wu. is a 80 y.o. male here today for follow up for complex sleep apnea started on BiPAP therapy. Sleep study on 02/19/2020 showed "Central and Complex Sleep Apnea at AHI of 42/h with a characteristic distribution of NREM apnea being dominant. No hypoxemia of significance. Paced cardiac rhythm." He continues to adjust to BiPAP therapy.  He is uncertain if he has noted any specific benefit in using BiPAP, however, his wife mentions that he is much more alert after using BiPAP.  He does not nap as much during the day.  He is now using a fullface mask and feels that this is more comfortable.  He feels that memory is stable.  He does not drive.  He is able to assist with managing his home.  He is followed closely by ophthalmology.  Compliance report dated 06/03/2020 through 07/02/2020 reveals that he used BiPAP 26 of the past 30 days for compliance of 87%.  He used BiPAP greater than 4 hours 19 of the past 30 days for compliance of 63%.  Average usage on days used was 5 hours and 2 minutes.  Residual AHI was 4.2 with IPAP of 16 cm  of water and EPAP of 11 cm of water, respiratory rate of 10.  There was no significant leak noted.  HISTORY: (copied from Dr Dohmeier's note on 02/13/2020)  Andrew Wuis a 80 y.o.  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 Shelia Media.  Chiefconcernaccording to patient : "I have sleep apnea, a pacemaker and can't sleep well, my memory may also be affected" [place]   I have the pleasure of seeing Andrew Wu. today,a right -handed Black or African American male with a possible sleep disorder. he   has a past medical history of Arthritis, Cardiac pacemaker in situ, Depressive disorder, not elsewhere classified, Presence of permanent cardiac pacemaker, Rib fractures (04/25/2019), Sinoatrial node dysfunction ([place]), Type II or unspecified type diabetes mellitus without mention of complication, not stated as uncontrolled, Unspecified essential hypertension, and Unspecified sleep apnea.  His pacemaker is a permanent implanted pacemaker without defibrillator function.  He received the device about 5 years ago.  His cardiac issues have been stable over the last 2 years.   His diabetes has been followed by Dr. Chalmers Cater. PT/ OT has noted some balance problems.    Sleeprelevant medical history:  Nocturia ; 1-2 times, no Tonsillectomy, rib cage injuries and whiplash  to cervical spine, glaucoma surgery." Foggy mind ".  Social history:Patient is retired from [employer] and lives in a household with spouse, sometimes grandchildren stay over. His wife's aunt lives with them. Pets are not present. Tobacco use- 2000. ETOH use - none for 50 years,  Caffeine intake in form of Coffee(1-2 ) Soda( occasional ) Tea ( none ) , no energy drinks. Regular exercise- none      Sleep habits are as follows:The patient's dinner time is between 6- 6.30 PM. He falls asleep while watching TV- The patient goes to bed at 9.30  PM and continues to sleep for 4 hours, wakes for one bathroom breaks, the first time at 3-4 AM.   The preferred sleep position is right side, with the support of one pillow.  Dreams are reportedly frequent. Vivid dreams.  6.30  AM is the usual rise time. The patient wakes up with his wife's alarm at 6.00.Andrew Wu  He reports not feeling refreshed or restored in AM, with symptoms such as dry mouth, no morning headaches and residual fatigue.  Naps are taken after lunch frequently, lasting from 20-30  minutes and are more refreshing .   REVIEW OF SYSTEMS: Out of a complete 14 system review of symptoms, the patient complains only of the following symptoms, vision loss, memory loss, frequent urination and all other reviewed systems are negative.  ESS: 10 FSS: 60  ALLERGIES: Allergies  Allergen Reactions  . Lisinopril Swelling    Facial swelling    . Amoxicillin Other (See Comments)    GI Upset  . Losartan Swelling  . Metformin And Related Other (See Comments)    GI upset  . Asa [Aspirin] Other (See Comments)    Stomach hurts - is able to take enteric coated aspirin    HOME MEDICATIONS: Outpatient Medications Prior to Visit  Medication Sig Dispense Refill  . aspirin EC 81 MG tablet Take 81 mg by mouth daily.    Andrew Wu atorvastatin (LIPITOR) 20 MG tablet Take 20 mg by mouth at bedtime.     . COMBIGAN 0.2-0.5 % ophthalmic solution Place 1 drop into the left eye 2 (two) times daily.     . insulin degludec (TRESIBA) 100 UNIT/ML FlexTouch Pen Inject 18 Units into the skin at bedtime.     . insulin lispro (HUMALOG) 100 UNIT/ML injection Inject 10 Units into the skin 3 (three) times daily before meals. Novalog    . latanoprost (XALATAN) 0.005 % ophthalmic solution Place 1 drop into both eyes at bedtime.    . insulin lispro (HUMALOG) 100 UNIT/ML injection Inject 10 Units into the skin 3 (three) times daily before meals. 10 unit every am, lunch and 8 U evening     No facility-administered medications  prior to visit.    PAST MEDICAL HISTORY: Past Medical History:  Diagnosis Date  . Arthritis   . Cardiac pacemaker in situ   . Depressive disorder, not elsewhere classified   . Presence of permanent cardiac pacemaker   . Rib fractures 04/25/2019   FROM FALL AT HOME   . Sinoatrial node dysfunction (HCC)   . Type II or unspecified type diabetes mellitus without mention of complication, not stated as uncontrolled   . Unspecified essential hypertension   . Unspecified sleep apnea     PAST SURGICAL HISTORY: Past Surgical History:  Procedure Laterality Date  . APPENDECTOMY    . HERNIA REPAIR    . INSERT / REPLACE / REMOVE PACEMAKER  2006 ?   Andrew Wu  pacemaker  02/23/08   medtronic Adapta-sinus node dysfunction   . PROSTATE SURGERY     SEED IMPLANT    FAMILY HISTORY: History reviewed. No pertinent family history.  SOCIAL HISTORY: Social History   Socioeconomic History  . Marital status: Married    Spouse name: Not on file  . Number of children: Not on file  . Years of education: Not on file  . Highest education level: Not on file  Occupational History  . Not on file  Tobacco Use  . Smoking status: Former Smoker    Types: Cigarettes  . Smokeless tobacco: Never Used  Vaping Use  . Vaping Use: Never used  Substance and Sexual Activity  . Alcohol use: No  . Drug use: No  . Sexual activity: Not on file  Other Topics Concern  . Not on file  Social History Narrative   Certified letter sent, not doing follow ups, returned signed by pt. No apts made 10/16/09   Social Determinants of Health   Financial Resource Strain: Not on file  Food Insecurity: Not on file  Transportation Needs: Not on file  Physical Activity: Not on file  Stress: Not on file  Social Connections: Not on file  Intimate Partner Violence: Not on file      PHYSICAL EXAM  Vitals:   12/29/20 0815  BP: 110/66  Pulse: 70  Weight: 161 lb (73 kg)  Height: 5\' 7"  (1.702 m)   Body mass index is 25.22  kg/m.  Generalized: Well developed, in no acute distress  Cardiology: normal rate and rhythm, no murmur noted Respiratory: clear to auscultation bilaterally  Neurological examination  Mentation: Alert oriented to time, place, history taking. Follows all commands speech and language fluent Motor: The motor testing reveals 5 over 5 strength of all 4 extremities. Good symmetric motor tone is noted throughout.  Gait and station: Gait is normal.    DIAGNOSTIC DATA (LABS, IMAGING, TESTING) - I reviewed patient records, labs, notes, testing and imaging myself where available.  MMSE - Mini Mental State Exam 07/03/2020  Orientation to time 3  Orientation to Place 4  Registration 3  Attention/ Calculation 5  Recall 2  Language- name 2 objects 2  Language- repeat 1  Language- follow 3 step command 2  Language- read & follow direction 1  Write a sentence 1  Copy design 1  Total score 25     Lab Results  Component Value Date   WBC 8.6 04/26/2019   HGB 13.2 04/26/2019   HCT 41.1 04/26/2019   MCV 84.2 04/26/2019   PLT 147 (L) 04/26/2019      Component Value Date/Time   NA 139 04/26/2019 0306   K 4.2 04/26/2019 0306   CL 106 04/26/2019 0306   CO2 23 04/26/2019 0306   GLUCOSE 213 (H) 04/26/2019 0306   BUN 20 04/26/2019 0306   CREATININE 1.01 04/26/2019 0306   CALCIUM 9.6 04/26/2019 0306   PROT 6.4 (L) 04/25/2019 1004   ALBUMIN 3.9 04/25/2019 1004   AST 25 04/25/2019 1004   ALT 29 04/25/2019 1004   ALKPHOS 68 04/25/2019 1004   BILITOT 1.3 (H) 04/25/2019 1004   GFRNONAA >60 04/26/2019 0306   GFRAA >60 04/26/2019 0306   No results found for: CHOL, HDL, LDLCALC, LDLDIRECT, TRIG, CHOLHDL Lab Results  Component Value Date   HGBA1C 9.1 (H) 04/25/2019   No results found for: VITAMINB12 No results found for: TSH     ASSESSMENT AND PLAN 80 y.o. year old  male  has a past medical history of Arthritis, Cardiac pacemaker in situ, Depressive disorder, not elsewhere classified,  Presence of permanent cardiac pacemaker, Rib fractures (04/25/2019), Sinoatrial node dysfunction ([place]), Type II or unspecified type diabetes mellitus without mention of complication, not stated as uncontrolled, Unspecified essential hypertension, and Unspecified sleep apnea. here with     ICD-10-CM   1. Complex sleep apnea syndrome  G47.31   2. Sleep apnea treated with nocturnal BiPAP  G47.30 For home use only DME Bipap      Mr. Calame reports sleeping better with BiPAP therapy.  Compliance report reveals sub optimal usage. He feels mask is too tight and needs to be refitted. I have provided a printed prescription for a mask refit. They will call me with updated DME information asap.  His wife does note that he is more alert during the day after using BiPAP therapy.  He is motivated to continue with therapy.  He was encouraged to use BiPAP nightly and for greater than 4 hours each night. He feels that his memory is stable.  He does not drive.  He was encouraged to continue regular physical and mental activity. He will follow-up closely with primary care and ophthalmology.  He will return to see me in 6 months, sooner if needed.  He verbalizes understanding and agreement with this plan.   Orders Placed This Encounter  Procedures  . For home use only DME Bipap    Mask refitting    Order Specific Question:   Length of Need    Answer:   Lifetime    Order Specific Question:   Inspiratory pressure    Answer:   OTHER SEE COMMENTS    Order Specific Question:   Expiratory pressure    Answer:   OTHER SEE COMMENTS     No orders of the defined types were placed in this encounter.     I spent 20 minutes with the patient. 50% of this time was spent counseling and educating patient on plan of care and medications.     Debbora Presto, FNP-C 12/29/2020, 8:56 AM [facility] [address] [place], [place] [address] [phone]

## 2020-12-25 NOTE — Patient Instructions (Addendum)
Please continue using your BiPAP regularly. While your insurance requires that you use BiPAP at least 4 hours each night on 70% of the nights, I recommend, that you not skip any nights and use it throughout the night if you can. Getting used to BiPAP and staying with the treatment long term does take time and patience and discipline. Untreated obstructive sleep apnea when it is moderate to severe can have an adverse impact on cardiovascular health and raise her risk for heart disease, arrhythmias, hypertension, congestive heart failure, stroke and diabetes. Untreated obstructive sleep apnea causes sleep disruption, nonrestorative sleep, and sleep deprivation. This can have an impact on your day to day functioning and cause daytime sleepiness and impairment of cognitive function, memory loss, mood disturbance, and problems focussing. Using BiPAP regularly can improve these symptoms.  Continue BiPAP nightly and greater than 4 hours each night. Call me with new DME information. Follow up in 6 months    Sleep Apnea Sleep apnea affects breathing during sleep. It causes breathing to stop for a short time or to become shallow. It can also increase the risk of:  Heart attack.  Stroke.  Being very overweight (obese).  Diabetes.  Heart failure.  Irregular heartbeat. The goal of treatment is to help you breathe normally again. What are the causes? There are three kinds of sleep apnea:  Obstructive sleep apnea. This is caused by a blocked or collapsed airway.  Central sleep apnea. This happens when the brain does not send the right signals to the muscles that control breathing.  Mixed sleep apnea. This is a combination of obstructive and central sleep apnea. The most common cause of this condition is a collapsed or blocked airway. This can happen if:  Your throat muscles are too relaxed.  Your tongue and tonsils are too large.  You are overweight.  Your airway is too small.   What  increases the risk?  Being overweight.  Smoking.  Having a small airway.  Being older.  Being male.  Drinking alcohol.  Taking medicines to calm yourself (sedatives or tranquilizers).  Having family members with the condition. What are the signs or symptoms?  Trouble staying asleep.  Being sleepy or tired during the day.  Getting angry a lot.  Loud snoring.  Headaches in the morning.  Not being able to focus your mind (concentrate).  Forgetting things.  Less interest in sex.  Mood swings.  Personality changes.  Feelings of sadness (depression).  Waking up a lot during the night to pee (urinate).  Dry mouth.  Sore throat. How is this diagnosed?  Your medical history.  A physical exam.  A test that is done when you are sleeping (sleep study). The test is most often done in a sleep lab but may also be done at home. How is this treated?  Sleeping on your side.  Using a medicine to get rid of mucus in your nose (decongestant).  Avoiding the use of alcohol, medicines to help you relax, or certain pain medicines (narcotics).  Losing weight, if needed.  Changing your diet.  Not smoking.  Using a machine to open your airway while you sleep, such as: ? An oral appliance. This is a mouthpiece that shifts your lower jaw forward. ? A CPAP device. This device blows air through a mask when you breathe out (exhale). ? An EPAP device. This has valves that you put in each nostril. ? A BPAP device. This device blows air through a mask when you  breathe in (inhale) and breathe out.  Having surgery if other treatments do not work. It is important to get treatment for sleep apnea. Without treatment, it can lead to:  High blood pressure.  Coronary artery disease.  In men, not being able to have an erection (impotence).  Reduced thinking ability.   Follow these instructions at home: Lifestyle  Make changes that your doctor recommends.  Eat a healthy  diet.  Lose weight if needed.  Avoid alcohol, medicines to help you relax, and some pain medicines.  Do not use any products that contain nicotine or tobacco, such as cigarettes, e-cigarettes, and chewing tobacco. If you need help quitting, ask your doctor. General instructions  Take over-the-counter and prescription medicines only as told by your doctor.  If you were given a machine to use while you sleep, use it only as told by your doctor.  If you are having surgery, make sure to tell your doctor you have sleep apnea. You may need to bring your device with you.  Keep all follow-up visits as told by your doctor. This is important. Contact a doctor if:  The machine that you were given to use during sleep bothers you or does not seem to be working.  You do not get better.  You get worse. Get help right away if:  Your chest hurts.  You have trouble breathing in enough air.  You have an uncomfortable feeling in your back, arms, or stomach.  You have trouble talking.  One side of your body feels weak.  A part of your face is hanging down. These symptoms may be an emergency. Do not wait to see if the symptoms will go away. Get medical help right away. Call your local emergency services (911 in the [place] Do not drive yourself to the hospital. Summary  This condition affects breathing during sleep.  The most common cause is a collapsed or blocked airway.  The goal of treatment is to help you breathe normally while you sleep. This information is not intended to replace advice given to you by your health care provider. Make sure you discuss any questions you have with your health care provider. Document Revised: 07/07/2018 Document Reviewed: 05/16/2018 Elsevier Patient Education  [employer].

## 2020-12-29 ENCOUNTER — Encounter: Payer: Self-pay | Admitting: Family Medicine

## 2020-12-29 ENCOUNTER — Ambulatory Visit: Payer: Medicare Other | Admitting: Family Medicine

## 2020-12-29 ENCOUNTER — Ambulatory Visit: Payer: Medicare HMO | Admitting: Family Medicine

## 2020-12-29 VITALS — BP 110/66 | HR 70 | Ht 67.0 in | Wt 161.0 lb

## 2020-12-29 DIAGNOSIS — G4731 Primary central sleep apnea: Secondary | ICD-10-CM | POA: Diagnosis not present

## 2020-12-29 DIAGNOSIS — G4730 Sleep apnea, unspecified: Secondary | ICD-10-CM

## 2021-01-20 DIAGNOSIS — I495 Sick sinus syndrome: Secondary | ICD-10-CM | POA: Diagnosis not present

## 2021-02-10 DIAGNOSIS — H401133 Primary open-angle glaucoma, bilateral, severe stage: Secondary | ICD-10-CM | POA: Diagnosis not present

## 2021-02-12 DIAGNOSIS — G4733 Obstructive sleep apnea (adult) (pediatric): Secondary | ICD-10-CM | POA: Diagnosis not present

## 2021-02-27 DIAGNOSIS — Z0100 Encounter for examination of eyes and vision without abnormal findings: Secondary | ICD-10-CM | POA: Diagnosis not present

## 2021-03-05 ENCOUNTER — Ambulatory Visit: Payer: Medicare HMO | Admitting: Podiatry

## 2021-03-12 DIAGNOSIS — I10 Essential (primary) hypertension: Secondary | ICD-10-CM | POA: Diagnosis not present

## 2021-03-12 DIAGNOSIS — Z794 Long term (current) use of insulin: Secondary | ICD-10-CM | POA: Diagnosis not present

## 2021-03-12 DIAGNOSIS — E782 Mixed hyperlipidemia: Secondary | ICD-10-CM | POA: Diagnosis not present

## 2021-03-12 DIAGNOSIS — Z950 Presence of cardiac pacemaker: Secondary | ICD-10-CM | POA: Diagnosis not present

## 2021-03-12 DIAGNOSIS — G4733 Obstructive sleep apnea (adult) (pediatric): Secondary | ICD-10-CM | POA: Diagnosis not present

## 2021-03-12 DIAGNOSIS — I495 Sick sinus syndrome: Secondary | ICD-10-CM | POA: Diagnosis not present

## 2021-03-12 DIAGNOSIS — E119 Type 2 diabetes mellitus without complications: Secondary | ICD-10-CM | POA: Diagnosis not present

## 2021-03-12 DIAGNOSIS — I2510 Atherosclerotic heart disease of native coronary artery without angina pectoris: Secondary | ICD-10-CM | POA: Diagnosis not present

## 2021-03-15 DIAGNOSIS — G4733 Obstructive sleep apnea (adult) (pediatric): Secondary | ICD-10-CM | POA: Diagnosis not present

## 2021-04-08 DIAGNOSIS — E7800 Pure hypercholesterolemia, unspecified: Secondary | ICD-10-CM | POA: Diagnosis not present

## 2021-04-08 DIAGNOSIS — E1165 Type 2 diabetes mellitus with hyperglycemia: Secondary | ICD-10-CM | POA: Diagnosis not present

## 2021-04-14 DIAGNOSIS — G4733 Obstructive sleep apnea (adult) (pediatric): Secondary | ICD-10-CM | POA: Diagnosis not present

## 2021-04-15 ENCOUNTER — Other Ambulatory Visit: Payer: Self-pay

## 2021-04-15 ENCOUNTER — Ambulatory Visit: Payer: Medicare HMO | Admitting: Podiatry

## 2021-04-15 ENCOUNTER — Ambulatory Visit (INDEPENDENT_AMBULATORY_CARE_PROVIDER_SITE_OTHER): Payer: Medicare HMO

## 2021-04-15 ENCOUNTER — Encounter: Payer: Self-pay | Admitting: Podiatry

## 2021-04-15 ENCOUNTER — Ambulatory Visit: Payer: Medicare HMO

## 2021-04-15 DIAGNOSIS — M2042 Other hammer toe(s) (acquired), left foot: Secondary | ICD-10-CM

## 2021-04-15 DIAGNOSIS — E1165 Type 2 diabetes mellitus with hyperglycemia: Secondary | ICD-10-CM | POA: Diagnosis not present

## 2021-04-15 DIAGNOSIS — M2041 Other hammer toe(s) (acquired), right foot: Secondary | ICD-10-CM | POA: Diagnosis not present

## 2021-04-15 DIAGNOSIS — Z794 Long term (current) use of insulin: Secondary | ICD-10-CM

## 2021-04-15 DIAGNOSIS — E119 Type 2 diabetes mellitus without complications: Secondary | ICD-10-CM | POA: Diagnosis not present

## 2021-04-15 DIAGNOSIS — I10 Essential (primary) hypertension: Secondary | ICD-10-CM | POA: Diagnosis not present

## 2021-04-15 DIAGNOSIS — C61 Malignant neoplasm of prostate: Secondary | ICD-10-CM | POA: Insufficient documentation

## 2021-04-15 DIAGNOSIS — E7800 Pure hypercholesterolemia, unspecified: Secondary | ICD-10-CM | POA: Diagnosis not present

## 2021-04-15 DIAGNOSIS — E785 Hyperlipidemia, unspecified: Secondary | ICD-10-CM | POA: Insufficient documentation

## 2021-04-15 NOTE — Progress Notes (Signed)
  Subjective:  Patient ID: Andrew Hurt., male    DOB: 08/03/1941,  MRN: [id number]  Chief Complaint  Patient presents with   Diabetes    A1C  9.1    80 y.o. male presents with the above complaint. History confirmed with patient.  He is doing well we have not seen him in some time.  His A1c is down to 7.2%.  He does have a check today.  Interested in having new diabetic shoes made these been very helpful for him in the past.  Objective:  Physical Exam: warm, good capillary refill, no trophic changes or ulcerative lesions, normal DP and PT pulses, normal monofilament exam, normal sensory exam, and semireducible hammertoe deformities bilateral. Assessment:   1. Hammertoe of left foot   2. Type 2 diabetes mellitus without complication, with long-term current use of insulin ([place])   3. Hammertoe of right foot      Plan:  Patient was evaluated and treated and all questions answered.  Patient educated on diabetes. Discussed proper diabetic foot care and discussed risks and complications of disease. Educated patient in depth on reasons to return to the office immediately should he/she discover anything concerning or new on the feet. All questions answered. Discussed proper shoes as well.   He was also seen by the pedorthist today for fitting for diabetic shoes and insoles.  No follow-ups on file.

## 2021-04-21 DIAGNOSIS — I495 Sick sinus syndrome: Secondary | ICD-10-CM | POA: Diagnosis not present

## 2021-05-15 DIAGNOSIS — G4733 Obstructive sleep apnea (adult) (pediatric): Secondary | ICD-10-CM | POA: Diagnosis not present

## 2021-05-19 DIAGNOSIS — H401133 Primary open-angle glaucoma, bilateral, severe stage: Secondary | ICD-10-CM | POA: Diagnosis not present

## 2021-05-19 DIAGNOSIS — H2512 Age-related nuclear cataract, left eye: Secondary | ICD-10-CM | POA: Diagnosis not present

## 2021-05-19 DIAGNOSIS — E1149 Type 2 diabetes mellitus with other diabetic neurological complication: Secondary | ICD-10-CM | POA: Diagnosis not present

## 2021-05-20 DIAGNOSIS — G4733 Obstructive sleep apnea (adult) (pediatric): Secondary | ICD-10-CM | POA: Diagnosis not present

## 2021-06-11 DIAGNOSIS — I495 Sick sinus syndrome: Secondary | ICD-10-CM | POA: Diagnosis not present

## 2021-06-11 DIAGNOSIS — H6123 Impacted cerumen, bilateral: Secondary | ICD-10-CM | POA: Diagnosis not present

## 2021-06-11 DIAGNOSIS — I25118 Atherosclerotic heart disease of native coronary artery with other forms of angina pectoris: Secondary | ICD-10-CM | POA: Diagnosis not present

## 2021-06-11 DIAGNOSIS — Z01818 Encounter for other preprocedural examination: Secondary | ICD-10-CM | POA: Diagnosis not present

## 2021-06-11 DIAGNOSIS — Z9181 History of falling: Secondary | ICD-10-CM | POA: Diagnosis not present

## 2021-06-11 DIAGNOSIS — G4733 Obstructive sleep apnea (adult) (pediatric): Secondary | ICD-10-CM | POA: Diagnosis not present

## 2021-06-11 DIAGNOSIS — I10 Essential (primary) hypertension: Secondary | ICD-10-CM | POA: Diagnosis not present

## 2021-06-11 DIAGNOSIS — H6693 Otitis media, unspecified, bilateral: Secondary | ICD-10-CM | POA: Diagnosis not present

## 2021-06-11 DIAGNOSIS — E119 Type 2 diabetes mellitus without complications: Secondary | ICD-10-CM | POA: Diagnosis not present

## 2021-06-11 DIAGNOSIS — E782 Mixed hyperlipidemia: Secondary | ICD-10-CM | POA: Diagnosis not present

## 2021-06-11 DIAGNOSIS — Z950 Presence of cardiac pacemaker: Secondary | ICD-10-CM | POA: Diagnosis not present

## 2021-06-12 ENCOUNTER — Other Ambulatory Visit: Payer: Self-pay

## 2021-06-12 ENCOUNTER — Ambulatory Visit (INDEPENDENT_AMBULATORY_CARE_PROVIDER_SITE_OTHER): Payer: Medicare HMO

## 2021-06-12 DIAGNOSIS — M2041 Other hammer toe(s) (acquired), right foot: Secondary | ICD-10-CM | POA: Diagnosis not present

## 2021-06-12 DIAGNOSIS — E119 Type 2 diabetes mellitus without complications: Secondary | ICD-10-CM

## 2021-06-12 DIAGNOSIS — Z794 Long term (current) use of insulin: Secondary | ICD-10-CM

## 2021-06-12 DIAGNOSIS — M2042 Other hammer toe(s) (acquired), left foot: Secondary | ICD-10-CM | POA: Diagnosis not present

## 2021-06-12 NOTE — Progress Notes (Signed)
Patient presents for diabetic shoe pick up, shoes are tried on for good fit.  Patient received 1 Pair and 3 pairs custom molded diabetic inserts.  Verbal and written break in and wear instructions given.  Patient will follow up for scheduled routine care.

## 2021-06-15 DIAGNOSIS — E119 Type 2 diabetes mellitus without complications: Secondary | ICD-10-CM | POA: Diagnosis not present

## 2021-06-15 DIAGNOSIS — Z950 Presence of cardiac pacemaker: Secondary | ICD-10-CM | POA: Diagnosis not present

## 2021-06-15 DIAGNOSIS — H401123 Primary open-angle glaucoma, left eye, severe stage: Secondary | ICD-10-CM | POA: Diagnosis not present

## 2021-06-15 DIAGNOSIS — Z79899 Other long term (current) drug therapy: Secondary | ICD-10-CM | POA: Diagnosis not present

## 2021-06-15 DIAGNOSIS — G4733 Obstructive sleep apnea (adult) (pediatric): Secondary | ICD-10-CM | POA: Diagnosis not present

## 2021-06-15 DIAGNOSIS — H25812 Combined forms of age-related cataract, left eye: Secondary | ICD-10-CM | POA: Diagnosis not present

## 2021-06-15 DIAGNOSIS — Z794 Long term (current) use of insulin: Secondary | ICD-10-CM | POA: Diagnosis not present

## 2021-06-16 NOTE — Progress Notes (Signed)
Patient presents to the office today for diabetic shoe and insole measuring.  Patient was measured with brannock device to determine size and width for 1 pair of extra depth shoes and foam casted for 3 pair of insoles.   Documentation of medical necessity will be sent to patient's treating diabetic doctor to verify and sign.   Shoes and insoles will be ordered at that time and patient will be notified for an appointment for fitting when they arrive.

## 2021-07-01 ENCOUNTER — Ambulatory Visit: Payer: Medicare HMO | Admitting: Family Medicine

## 2021-07-03 DIAGNOSIS — H6062 Unspecified chronic otitis externa, left ear: Secondary | ICD-10-CM | POA: Diagnosis not present

## 2021-07-03 DIAGNOSIS — H6123 Impacted cerumen, bilateral: Secondary | ICD-10-CM | POA: Diagnosis not present

## 2021-07-03 DIAGNOSIS — H60393 Other infective otitis externa, bilateral: Secondary | ICD-10-CM | POA: Diagnosis not present

## 2021-07-03 DIAGNOSIS — J301 Allergic rhinitis due to pollen: Secondary | ICD-10-CM | POA: Diagnosis not present

## 2021-07-15 DIAGNOSIS — G4733 Obstructive sleep apnea (adult) (pediatric): Secondary | ICD-10-CM | POA: Diagnosis not present

## 2021-07-15 DIAGNOSIS — G4736 Sleep related hypoventilation in conditions classified elsewhere: Secondary | ICD-10-CM | POA: Diagnosis not present

## 2021-07-21 DIAGNOSIS — H401133 Primary open-angle glaucoma, bilateral, severe stage: Secondary | ICD-10-CM | POA: Diagnosis not present

## 2021-07-22 DIAGNOSIS — I495 Sick sinus syndrome: Secondary | ICD-10-CM | POA: Diagnosis not present

## 2021-08-18 ENCOUNTER — Other Ambulatory Visit: Payer: Self-pay | Admitting: Otolaryngology

## 2021-08-18 ENCOUNTER — Other Ambulatory Visit (HOSPITAL_COMMUNITY): Payer: Self-pay | Admitting: Otolaryngology

## 2021-08-18 DIAGNOSIS — H6063 Unspecified chronic otitis externa, bilateral: Secondary | ICD-10-CM | POA: Diagnosis not present

## 2021-08-18 DIAGNOSIS — H903 Sensorineural hearing loss, bilateral: Secondary | ICD-10-CM | POA: Diagnosis not present

## 2021-08-18 DIAGNOSIS — H60392 Other infective otitis externa, left ear: Secondary | ICD-10-CM | POA: Diagnosis not present

## 2021-08-18 DIAGNOSIS — H6123 Impacted cerumen, bilateral: Secondary | ICD-10-CM | POA: Diagnosis not present

## 2021-08-18 DIAGNOSIS — H9312 Tinnitus, left ear: Secondary | ICD-10-CM

## 2021-08-19 ENCOUNTER — Other Ambulatory Visit: Payer: Self-pay | Admitting: Otolaryngology

## 2021-08-19 DIAGNOSIS — G9601 Cranial cerebrospinal fluid leak, spontaneous: Secondary | ICD-10-CM

## 2021-09-11 ENCOUNTER — Other Ambulatory Visit: Payer: Self-pay

## 2021-09-11 ENCOUNTER — Other Ambulatory Visit: Payer: Medicare Other

## 2021-09-11 ENCOUNTER — Ambulatory Visit
Admission: RE | Admit: 2021-09-11 | Discharge: 2021-09-11 | Disposition: A | Discharge status: Home / Self Care | Payer: Medicare HMO | Source: Ambulatory Visit | Attending: Otolaryngology | Admitting: Otolaryngology

## 2021-09-11 DIAGNOSIS — Z125 Encounter for screening for malignant neoplasm of prostate: Secondary | ICD-10-CM | POA: Diagnosis not present

## 2021-09-11 DIAGNOSIS — G319 Degenerative disease of nervous system, unspecified: Secondary | ICD-10-CM | POA: Diagnosis not present

## 2021-09-11 DIAGNOSIS — G9601 Cranial cerebrospinal fluid leak, spontaneous: Secondary | ICD-10-CM

## 2021-09-11 DIAGNOSIS — E7801 Familial hypercholesterolemia: Secondary | ICD-10-CM | POA: Diagnosis not present

## 2021-09-11 DIAGNOSIS — E1165 Type 2 diabetes mellitus with hyperglycemia: Secondary | ICD-10-CM | POA: Diagnosis not present

## 2021-09-11 DIAGNOSIS — H9312 Tinnitus, left ear: Secondary | ICD-10-CM

## 2021-09-11 DIAGNOSIS — I10 Essential (primary) hypertension: Secondary | ICD-10-CM | POA: Diagnosis not present

## 2021-09-11 LAB — POCT I-STAT CREATININE: Creatinine, Ser: 1 mg/dL (ref 0.61–1.24)

## 2021-09-11 MED ORDER — IOHEXOL 300 MG/ML  SOLN
75.0000 mL | Freq: Once | INTRAMUSCULAR | Status: AC | PRN
  Administered 2021-09-11: 75 mL via INTRAVENOUS

## 2021-09-16 DIAGNOSIS — I10 Essential (primary) hypertension: Secondary | ICD-10-CM | POA: Diagnosis not present

## 2021-09-16 DIAGNOSIS — I2510 Atherosclerotic heart disease of native coronary artery without angina pectoris: Secondary | ICD-10-CM | POA: Diagnosis not present

## 2021-09-16 DIAGNOSIS — R4589 Other symptoms and signs involving emotional state: Secondary | ICD-10-CM | POA: Diagnosis not present

## 2021-09-16 DIAGNOSIS — Z0000 Encounter for general adult medical examination without abnormal findings: Secondary | ICD-10-CM | POA: Diagnosis not present

## 2021-09-16 DIAGNOSIS — G319 Degenerative disease of nervous system, unspecified: Secondary | ICD-10-CM | POA: Diagnosis not present

## 2021-09-16 DIAGNOSIS — G4733 Obstructive sleep apnea (adult) (pediatric): Secondary | ICD-10-CM | POA: Diagnosis not present

## 2021-09-16 DIAGNOSIS — E1149 Type 2 diabetes mellitus with other diabetic neurological complication: Secondary | ICD-10-CM | POA: Diagnosis not present

## 2021-10-15 DIAGNOSIS — E1165 Type 2 diabetes mellitus with hyperglycemia: Secondary | ICD-10-CM | POA: Diagnosis not present

## 2021-10-15 DIAGNOSIS — E7800 Pure hypercholesterolemia, unspecified: Secondary | ICD-10-CM | POA: Diagnosis not present

## 2021-10-15 DIAGNOSIS — I10 Essential (primary) hypertension: Secondary | ICD-10-CM | POA: Diagnosis not present

## 2021-10-20 DIAGNOSIS — I495 Sick sinus syndrome: Secondary | ICD-10-CM | POA: Diagnosis not present

## 2021-10-21 DIAGNOSIS — I495 Sick sinus syndrome: Secondary | ICD-10-CM | POA: Diagnosis not present

## 2021-10-21 DIAGNOSIS — Z794 Long term (current) use of insulin: Secondary | ICD-10-CM | POA: Diagnosis not present

## 2021-10-21 DIAGNOSIS — E119 Type 2 diabetes mellitus without complications: Secondary | ICD-10-CM | POA: Diagnosis not present

## 2021-10-21 DIAGNOSIS — Z01818 Encounter for other preprocedural examination: Secondary | ICD-10-CM | POA: Diagnosis not present

## 2021-10-21 DIAGNOSIS — I25118 Atherosclerotic heart disease of native coronary artery with other forms of angina pectoris: Secondary | ICD-10-CM | POA: Diagnosis not present

## 2021-10-21 DIAGNOSIS — G4733 Obstructive sleep apnea (adult) (pediatric): Secondary | ICD-10-CM | POA: Diagnosis not present

## 2021-10-21 DIAGNOSIS — I10 Essential (primary) hypertension: Secondary | ICD-10-CM | POA: Diagnosis not present

## 2021-10-21 DIAGNOSIS — Z950 Presence of cardiac pacemaker: Secondary | ICD-10-CM | POA: Diagnosis not present

## 2021-10-21 DIAGNOSIS — E782 Mixed hyperlipidemia: Secondary | ICD-10-CM | POA: Diagnosis not present

## 2021-10-22 ENCOUNTER — Ambulatory Visit
Admission: RE | Admit: 2021-10-22 | Discharge: 2021-10-22 | Disposition: A | Discharge status: Home / Self Care | Payer: Medicare HMO | Source: Ambulatory Visit | Attending: Internal Medicine | Admitting: Internal Medicine

## 2021-10-22 DIAGNOSIS — I10 Essential (primary) hypertension: Secondary | ICD-10-CM | POA: Diagnosis not present

## 2021-10-22 DIAGNOSIS — I495 Sick sinus syndrome: Secondary | ICD-10-CM | POA: Diagnosis not present

## 2021-10-22 DIAGNOSIS — Z950 Presence of cardiac pacemaker: Secondary | ICD-10-CM | POA: Diagnosis not present

## 2021-10-22 DIAGNOSIS — E119 Type 2 diabetes mellitus without complications: Secondary | ICD-10-CM | POA: Diagnosis not present

## 2021-10-22 DIAGNOSIS — Z01818 Encounter for other preprocedural examination: Secondary | ICD-10-CM | POA: Diagnosis not present

## 2021-10-22 DIAGNOSIS — Z794 Long term (current) use of insulin: Secondary | ICD-10-CM | POA: Diagnosis not present

## 2021-10-22 DIAGNOSIS — G4733 Obstructive sleep apnea (adult) (pediatric): Secondary | ICD-10-CM | POA: Diagnosis not present

## 2021-10-22 DIAGNOSIS — I25118 Atherosclerotic heart disease of native coronary artery with other forms of angina pectoris: Secondary | ICD-10-CM | POA: Diagnosis not present

## 2021-10-22 DIAGNOSIS — E782 Mixed hyperlipidemia: Secondary | ICD-10-CM | POA: Diagnosis not present

## 2021-10-27 DIAGNOSIS — Z0100 Encounter for examination of eyes and vision without abnormal findings: Secondary | ICD-10-CM | POA: Diagnosis not present

## 2021-10-27 DIAGNOSIS — H401133 Primary open-angle glaucoma, bilateral, severe stage: Secondary | ICD-10-CM | POA: Diagnosis not present

## 2021-11-05 ENCOUNTER — Other Ambulatory Visit: Payer: Self-pay | Admitting: Internal Medicine

## 2021-11-05 DIAGNOSIS — R911 Solitary pulmonary nodule: Secondary | ICD-10-CM

## 2021-11-10 ENCOUNTER — Ambulatory Visit
Admission: RE | Admit: 2021-11-10 | Discharge: 2021-11-10 | Disposition: A | Discharge status: Home / Self Care | Payer: Medicare HMO | Source: Ambulatory Visit | Attending: Internal Medicine | Admitting: Internal Medicine

## 2021-11-10 ENCOUNTER — Other Ambulatory Visit: Payer: Self-pay

## 2021-11-10 DIAGNOSIS — J439 Emphysema, unspecified: Secondary | ICD-10-CM | POA: Diagnosis not present

## 2021-11-10 DIAGNOSIS — I495 Sick sinus syndrome: Secondary | ICD-10-CM | POA: Diagnosis not present

## 2021-11-10 DIAGNOSIS — Z01818 Encounter for other preprocedural examination: Secondary | ICD-10-CM | POA: Diagnosis not present

## 2021-11-10 DIAGNOSIS — R911 Solitary pulmonary nodule: Secondary | ICD-10-CM | POA: Insufficient documentation

## 2021-11-10 DIAGNOSIS — I700 Atherosclerosis of aorta: Secondary | ICD-10-CM | POA: Diagnosis not present

## 2021-11-10 DIAGNOSIS — Z79899 Other long term (current) drug therapy: Secondary | ICD-10-CM | POA: Diagnosis not present

## 2021-11-10 DIAGNOSIS — R918 Other nonspecific abnormal finding of lung field: Secondary | ICD-10-CM | POA: Diagnosis not present

## 2021-11-10 LAB — POCT I-STAT CREATININE: Creatinine, Ser: 1.2 mg/dL (ref 0.61–1.24)

## 2021-11-10 MED ORDER — IOHEXOL 300 MG/ML  SOLN
75.0000 mL | Freq: Once | INTRAMUSCULAR | Status: AC | PRN
  Administered 2021-11-10: 75 mL via INTRAVENOUS

## 2021-11-11 DIAGNOSIS — J449 Chronic obstructive pulmonary disease, unspecified: Secondary | ICD-10-CM | POA: Diagnosis not present

## 2021-11-11 DIAGNOSIS — G4733 Obstructive sleep apnea (adult) (pediatric): Secondary | ICD-10-CM | POA: Diagnosis not present

## 2021-11-11 DIAGNOSIS — R918 Other nonspecific abnormal finding of lung field: Secondary | ICD-10-CM | POA: Diagnosis not present

## 2021-11-12 ENCOUNTER — Other Ambulatory Visit: Payer: Self-pay | Admitting: Specialist

## 2021-11-12 DIAGNOSIS — R918 Other nonspecific abnormal finding of lung field: Secondary | ICD-10-CM

## 2021-11-17 ENCOUNTER — Encounter
Admission: RE | Disposition: A | Discharge status: Home / Self Care | Payer: Self-pay | Source: Home / Self Care | Attending: Cardiology

## 2021-11-17 ENCOUNTER — Ambulatory Visit
Admission: RE | Admit: 2021-11-17 | Discharge: 2021-11-17 | Disposition: A | Discharge status: Home / Self Care | Payer: Medicare HMO | Attending: Cardiology | Admitting: Cardiology

## 2021-11-17 ENCOUNTER — Other Ambulatory Visit: Payer: Self-pay

## 2021-11-17 ENCOUNTER — Encounter: Payer: Self-pay | Admitting: Cardiology

## 2021-11-17 DIAGNOSIS — I495 Sick sinus syndrome: Secondary | ICD-10-CM | POA: Diagnosis not present

## 2021-11-17 DIAGNOSIS — Z01818 Encounter for other preprocedural examination: Secondary | ICD-10-CM

## 2021-11-17 DIAGNOSIS — E119 Type 2 diabetes mellitus without complications: Secondary | ICD-10-CM | POA: Diagnosis not present

## 2021-11-17 DIAGNOSIS — Z794 Long term (current) use of insulin: Secondary | ICD-10-CM | POA: Insufficient documentation

## 2021-11-17 DIAGNOSIS — I2510 Atherosclerotic heart disease of native coronary artery without angina pectoris: Secondary | ICD-10-CM | POA: Insufficient documentation

## 2021-11-17 DIAGNOSIS — E782 Mixed hyperlipidemia: Secondary | ICD-10-CM | POA: Insufficient documentation

## 2021-11-17 DIAGNOSIS — I10 Essential (primary) hypertension: Secondary | ICD-10-CM | POA: Diagnosis not present

## 2021-11-17 DIAGNOSIS — Z45010 Encounter for checking and testing of cardiac pacemaker pulse generator [battery]: Secondary | ICD-10-CM | POA: Insufficient documentation

## 2021-11-17 DIAGNOSIS — G4733 Obstructive sleep apnea (adult) (pediatric): Secondary | ICD-10-CM | POA: Insufficient documentation

## 2021-11-17 DIAGNOSIS — R001 Bradycardia, unspecified: Secondary | ICD-10-CM | POA: Diagnosis not present

## 2021-11-17 HISTORY — PX: PPM GENERATOR CHANGEOUT: EP1233

## 2021-11-17 SURGERY — PPM GENERATOR CHANGEOUT
Anesthesia: Moderate Sedation

## 2021-11-17 MED ORDER — SODIUM CHLORIDE 0.9 % IV SOLN
80.0000 mg | INTRAVENOUS | Status: AC
  Administered 2021-11-17: 80 mg
  Filled 2021-11-17: qty 80

## 2021-11-17 MED ORDER — ACETAMINOPHEN 325 MG PO TABS: 325.0000 mg | ORAL_TABLET | ORAL | Status: DC | PRN

## 2021-11-17 MED ORDER — VANCOMYCIN HCL IN DEXTROSE 1-5 GM/200ML-% IV SOLN
1000.0000 mg | INTRAVENOUS | Status: DC
  Filled 2021-11-17: qty 200

## 2021-11-17 MED ORDER — FENTANYL CITRATE (PF) 100 MCG/2ML IJ SOLN
INTRAMUSCULAR | Status: DC | PRN
  Administered 2021-11-17: 25 ug via INTRAVENOUS

## 2021-11-17 MED ORDER — HEPARIN (PORCINE) IN NACL 1000-0.9 UT/500ML-% IV SOLN
INTRAVENOUS | Status: AC
  Filled 2021-11-17: qty 1000

## 2021-11-17 MED ORDER — MIDAZOLAM HCL 2 MG/2ML IJ SOLN
INTRAMUSCULAR | Status: DC | PRN
  Administered 2021-11-17: .5 mg via INTRAVENOUS

## 2021-11-17 MED ORDER — FENTANYL CITRATE (PF) 100 MCG/2ML IJ SOLN
INTRAMUSCULAR | Status: AC
  Filled 2021-11-17: qty 2

## 2021-11-17 MED ORDER — MIDAZOLAM HCL 2 MG/2ML IJ SOLN
INTRAMUSCULAR | Status: AC
  Filled 2021-11-17: qty 2

## 2021-11-17 MED ORDER — ONDANSETRON HCL 4 MG/2ML IJ SOLN: 4.0000 mg | Freq: Four times a day (QID) | INTRAMUSCULAR | Status: DC | PRN

## 2021-11-17 MED ORDER — VANCOMYCIN HCL 1000 MG/200ML IV SOLN
1000.0000 mg | INTRAVENOUS | Status: AC
  Administered 2021-11-17: 1000 mg via INTRAVENOUS
  Filled 2021-11-17: qty 200

## 2021-11-17 MED ORDER — LIDOCAINE HCL 1 % IJ SOLN
INTRAMUSCULAR | Status: AC
  Filled 2021-11-17: qty 40

## 2021-11-17 MED ORDER — SODIUM CHLORIDE 0.9 % IV SOLN: INTRAVENOUS | Status: DC

## 2021-11-17 MED ORDER — CLARITHROMYCIN ER 500 MG PO TB24: 1000.0000 mg | ORAL_TABLET | Freq: Every day | ORAL | 0 refills | Status: AC

## 2021-11-17 MED ORDER — LIDOCAINE HCL (PF) 1 % IJ SOLN
INTRAMUSCULAR | Status: DC | PRN
  Administered 2021-11-17: 30 mL

## 2021-11-17 SURGICAL SUPPLY — 20 items
CABLE SURG 12 DISP A/V CHANNEL (MISCELLANEOUS) ×3 IMPLANT
COVER SURGICAL LIGHT HANDLE (MISCELLANEOUS) ×1 IMPLANT
DEVICE DSSCT PLSMBLD 3.0S LGHT (MISCELLANEOUS) IMPLANT
DRAPE INCISE 23X17 IOBAN STRL (DRAPES) ×1
DRAPE INCISE 23X17 STRL (DRAPES) IMPLANT
DRAPE INCISE IOBAN 23X17 STRL (DRAPES) ×1 IMPLANT
IPG PACE AZUR XT DR MRI W1DR01 (Pacemaker) IMPLANT
PACE AZURE XT DR MRI W1DR01 (Pacemaker) ×2 IMPLANT
PAD ELECT DEFIB RADIOL ZOLL (MISCELLANEOUS) ×1 IMPLANT
PLASMABLADE 3.0S W/LIGHT (MISCELLANEOUS) ×2
POUCH AIGIS-R ANTIBACT PPM (Mesh General) ×2 IMPLANT
POUCH AIGIS-R ANTIBACT PPM MED (Mesh General) IMPLANT
SET ATX SIMPLICITY (MISCELLANEOUS) IMPLANT
SPONGE XRAY 4X4 16PLY STRL (MISCELLANEOUS) ×2 IMPLANT
SUT VIC AB 2-0 CT1 27 (SUTURE) ×2
SUT VIC AB 2-0 CT1 TAPERPNT 27 (SUTURE) IMPLANT
SUT VICRYL 4-0  27 PS-2 BARIAT (SUTURE) ×2
SUT VICRYL 4-0 27 PS-2 BARIAT (SUTURE) ×1
SUTURE VICRYL 4-0 27 PS-2 BART (SUTURE) IMPLANT
TRAY PACEMAKER INSERTION (PACKS) ×2 IMPLANT

## 2021-11-17 NOTE — Discharge Instructions (Addendum)
Patient may shower on 11/20/2021.  Leave Steri-Strips on.Implantable Cardiac Device Battery Change, Care After  This sheet gives you information about how to care for yourself after your procedure. Your health care provider may also give you more specific instructions. If you have problems or questions, contact your health care provider. What can I expect after the procedure? After your procedure, it is common to have: Pain or soreness at the site where the cardiac device was inserted. Swelling at the site where the cardiac device was inserted. You should received an information card for your new device in 4-8 weeks. Follow these instructions at home: Incision care  Keep the incision clean and dry. Do not take baths, swim, or use a hot tub until after your wound check.  Do not shower for at least 7 days, or as directed by your health care provider. Pat the area dry with a clean towel. Do not rub the area. This may cause bleeding. Follow instructions from your health care provider about how to take care of your incision. Make sure you: Leave stitches (sutures), skin glue, or adhesive strips in place. These skin closures may need to stay in place for 2 weeks or longer. If adhesive strip edges start to loosen and curl up, you may trim the loose edges. Do not remove adhesive strips completely unless your health care provider tells you to do that. Check your incision area every day for signs of infection. Check for: More redness, swelling, or pain. More fluid or blood. Warmth. Pus or a bad smell. Activity Do not lift anything that is heavier than 10 lb (4.5 kg) until your health care provider says it is okay to do so. For the first week, or as long as told by your health care provider: Avoid lifting your affected arm higher than your shoulder. After 1 week, Be gentle when you move your arms over your head. It is okay to raise your arm to comb your hair. Avoid strenuous exercise. Ask your health  care provider when it is okay to: Resume your normal activities. Return to work or school. Resume sexual activity. Eating and drinking Eat a heart-healthy diet. This should include plenty of fresh fruits and vegetables, whole grains, low-fat dairy products, and lean protein like chicken and fish. Limit alcohol intake to no more than 1 drink a day for non-pregnant women and 2 drinks a day for men. One drink equals 12 oz of beer, 5 oz of wine, or 1 oz of hard liquor. Check ingredients and nutrition facts on packaged foods and beverages. Avoid the following types of food: Food that is high in salt (sodium). Food that is high in saturated fat, like full-fat dairy or red meat. Food that is high in trans fat, like fried food. Food and drinks that are high in sugar. Lifestyle Do not use any products that contain nicotine or tobacco, such as cigarettes and e-cigarettes. If you need help quitting, ask your health care provider. Take steps to manage and control your weight. Once cleared, get regular exercise. Aim for 150 minutes of moderate-intensity exercise (such as walking or yoga) or 75 minutes of vigorous exercise (such as running or swimming) each week. Manage other health problems, such as diabetes or high blood pressure. Ask your health care provider how you can manage these conditions. General instructions Do not drive for 24 hours after your procedure if you were given a medicine to help you relax (sedative). Take over-the-counter and prescription medicines only as told  by your health care provider. Avoid putting pressure on the area where the cardiac device was placed. If you need an MRI after your cardiac device has been placed, be sure to tell the health care provider who orders the MRI that you have a cardiac device. Avoid close and prolonged exposure to electrical devices that have strong magnetic fields. These include: Cell phones. Avoid keeping them in a pocket near the cardiac device,  and try using the ear opposite the cardiac device. MP3 players. Household appliances, like microwaves. Metal detectors. Electric generators. High-tension wires. Keep all follow-up visits as directed by your health care provider. This is important. Contact a health care provider if: You have pain at the incision site that is not relieved by over-the-counter or prescription medicines. You have any of these around your incision site or coming from it: More redness, swelling, or pain. Fluid or blood. Warmth to the touch. Pus or a bad smell. You have a fever. You feel brief, occasional palpitations, light-headedness, or any symptoms that you think might be related to your heart. Get help right away if: You experience chest pain that is different from the pain at the cardiac device site. You develop a red streak that extends above or below the incision site. You experience shortness of breath. You have palpitations or an irregular heartbeat. You have light-headedness that does not go away quickly. You faint or have dizzy spells. Your pulse suddenly drops or increases rapidly and does not return to normal. You begin to gain weight and your legs and ankles swell. Summary After your procedure, it is common to have pain, soreness, and some swelling where the cardiac device was inserted. Make sure to keep your incision clean and dry. Follow instructions from your health care provider about how to take care of your incision. Check your incision every day for signs of infection, such as more pain or swelling, pus or a bad smell, warmth, or leaking fluid and blood. Avoid strenuous exercise and lifting your left arm higher than your shoulder for 2 weeks, or as long as told by your health care provider. This information is not intended to replace advice given to you by your health care provider. Make sure you discuss any questions you have with your health care provider.

## 2021-11-18 ENCOUNTER — Encounter: Payer: Self-pay | Admitting: Cardiology

## 2021-11-19 ENCOUNTER — Encounter (HOSPITAL_COMMUNITY): Payer: Self-pay

## 2021-11-19 ENCOUNTER — Encounter (HOSPITAL_COMMUNITY): Payer: Medicare HMO

## 2021-11-26 DIAGNOSIS — F32A Depression, unspecified: Secondary | ICD-10-CM | POA: Diagnosis not present

## 2021-12-02 ENCOUNTER — Ambulatory Visit (HOSPITAL_COMMUNITY)
Admission: RE | Admit: 2021-12-02 | Discharge: 2021-12-02 | Disposition: A | Discharge status: Home / Self Care | Payer: Medicare HMO | Source: Ambulatory Visit | Attending: Specialist | Admitting: Specialist

## 2021-12-02 ENCOUNTER — Other Ambulatory Visit: Payer: Self-pay

## 2021-12-02 DIAGNOSIS — R918 Other nonspecific abnormal finding of lung field: Secondary | ICD-10-CM | POA: Insufficient documentation

## 2021-12-02 DIAGNOSIS — E049 Nontoxic goiter, unspecified: Secondary | ICD-10-CM | POA: Diagnosis not present

## 2021-12-02 DIAGNOSIS — I77811 Abdominal aortic ectasia: Secondary | ICD-10-CM | POA: Diagnosis not present

## 2021-12-02 DIAGNOSIS — C7972 Secondary malignant neoplasm of left adrenal gland: Secondary | ICD-10-CM | POA: Diagnosis not present

## 2021-12-02 DIAGNOSIS — I2510 Atherosclerotic heart disease of native coronary artery without angina pectoris: Secondary | ICD-10-CM | POA: Diagnosis not present

## 2021-12-02 LAB — GLUCOSE, CAPILLARY: Glucose-Capillary: 160 mg/dL — ABNORMAL HIGH (ref 70–99)

## 2021-12-02 MED ORDER — FLUDEOXYGLUCOSE F - 18 (FDG) INJECTION
8.0000 | Freq: Once | INTRAVENOUS | Status: AC | PRN
  Administered 2021-12-02: 8 via INTRAVENOUS

## 2021-12-04 DIAGNOSIS — J439 Emphysema, unspecified: Secondary | ICD-10-CM | POA: Diagnosis not present

## 2021-12-04 DIAGNOSIS — R918 Other nonspecific abnormal finding of lung field: Secondary | ICD-10-CM | POA: Diagnosis not present

## 2021-12-04 DIAGNOSIS — E278 Other specified disorders of adrenal gland: Secondary | ICD-10-CM | POA: Diagnosis not present

## 2021-12-04 DIAGNOSIS — R0609 Other forms of dyspnea: Secondary | ICD-10-CM | POA: Diagnosis not present

## 2021-12-09 ENCOUNTER — Other Ambulatory Visit: Payer: Self-pay | Admitting: Specialist

## 2021-12-09 DIAGNOSIS — R918 Other nonspecific abnormal finding of lung field: Secondary | ICD-10-CM

## 2021-12-10 NOTE — Progress Notes (Signed)
Spoke with patient's spouse (Iris). Need to arrive at 8:30 for 9:30 procedure, NPO after midnight, need to hold aspirin 3 days prior to procedure (LD 12/11/21), need to hold insulin day of procedure, need for driver post procedure. Patient's spouse verbalized understanding. ?

## 2021-12-14 ENCOUNTER — Other Ambulatory Visit: Payer: Self-pay | Admitting: Radiology

## 2021-12-15 ENCOUNTER — Other Ambulatory Visit: Payer: Self-pay

## 2021-12-15 ENCOUNTER — Ambulatory Visit
Admission: RE | Admit: 2021-12-15 | Discharge: 2021-12-15 | Disposition: A | Discharge status: Home / Self Care | Payer: Medicare HMO | Source: Ambulatory Visit | Attending: Specialist | Admitting: Specialist

## 2021-12-15 ENCOUNTER — Ambulatory Visit
Admission: RE | Admit: 2021-12-15 | Discharge: 2021-12-15 | Disposition: A | Discharge status: Home / Self Care | Payer: Medicare HMO | Source: Ambulatory Visit | Attending: Interventional Radiology | Admitting: Interventional Radiology

## 2021-12-15 DIAGNOSIS — Z9889 Other specified postprocedural states: Secondary | ICD-10-CM

## 2021-12-15 DIAGNOSIS — Z48813 Encounter for surgical aftercare following surgery on the respiratory system: Secondary | ICD-10-CM | POA: Diagnosis not present

## 2021-12-15 DIAGNOSIS — I495 Sick sinus syndrome: Secondary | ICD-10-CM | POA: Diagnosis not present

## 2021-12-15 DIAGNOSIS — J439 Emphysema, unspecified: Secondary | ICD-10-CM | POA: Diagnosis not present

## 2021-12-15 DIAGNOSIS — C3411 Malignant neoplasm of upper lobe, right bronchus or lung: Secondary | ICD-10-CM | POA: Diagnosis not present

## 2021-12-15 DIAGNOSIS — F32A Depression, unspecified: Secondary | ICD-10-CM | POA: Insufficient documentation

## 2021-12-15 DIAGNOSIS — E119 Type 2 diabetes mellitus without complications: Secondary | ICD-10-CM | POA: Insufficient documentation

## 2021-12-15 DIAGNOSIS — R918 Other nonspecific abnormal finding of lung field: Secondary | ICD-10-CM | POA: Diagnosis not present

## 2021-12-15 DIAGNOSIS — J984 Other disorders of lung: Secondary | ICD-10-CM | POA: Insufficient documentation

## 2021-12-15 DIAGNOSIS — Z950 Presence of cardiac pacemaker: Secondary | ICD-10-CM | POA: Diagnosis not present

## 2021-12-15 LAB — CBC
HCT: 33.5 % — ABNORMAL LOW (ref 39.0–52.0)
Hemoglobin: 10.6 g/dL — ABNORMAL LOW (ref 13.0–17.0)
MCH: 26.3 pg (ref 26.0–34.0)
MCHC: 31.6 g/dL (ref 30.0–36.0)
MCV: 83.1 fL (ref 80.0–100.0)
Platelets: 246 10*3/uL (ref 150–400)
RBC: 4.03 MIL/uL — ABNORMAL LOW (ref 4.22–5.81)
RDW: 14.3 % (ref 11.5–15.5)
WBC: 15.1 10*3/uL — ABNORMAL HIGH (ref 4.0–10.5)
nRBC: 0 % (ref 0.0–0.2)

## 2021-12-15 LAB — GLUCOSE, CAPILLARY
Glucose-Capillary: 279 mg/dL — ABNORMAL HIGH (ref 70–99)
Glucose-Capillary: 250 mg/dL — ABNORMAL HIGH (ref 70–99)

## 2021-12-15 LAB — PROTIME-INR
INR: 1.1 (ref 0.8–1.2)
Prothrombin Time: 14.1 seconds (ref 11.4–15.2)

## 2021-12-15 MED ORDER — MIDAZOLAM HCL 2 MG/2ML IJ SOLN
INTRAMUSCULAR | Status: AC
  Filled 2021-12-15: qty 2

## 2021-12-15 MED ORDER — FENTANYL CITRATE (PF) 100 MCG/2ML IJ SOLN
INTRAMUSCULAR | Status: AC | PRN
Start: 2021-12-15 — End: 2021-12-15
  Administered 2021-12-15: 50 ug via INTRAVENOUS

## 2021-12-15 MED ORDER — SODIUM CHLORIDE 0.9 % IV SOLN: INTRAVENOUS | Status: DC

## 2021-12-15 MED ORDER — FENTANYL CITRATE (PF) 100 MCG/2ML IJ SOLN
INTRAMUSCULAR | Status: AC
  Filled 2021-12-15: qty 2

## 2021-12-15 MED ORDER — MIDAZOLAM HCL 5 MG/5ML IJ SOLN
INTRAMUSCULAR | Status: AC | PRN
  Administered 2021-12-15: 1 mg via INTRAVENOUS

## 2021-12-15 NOTE — H&P (Signed)
? ?Chief Complaint: ?Patient was seen in consultation today for lung mass biopsy  ? ?Referring Physician(s): ?Fleming,Herbon E ? ?Supervising Physician: Sandi Mariscal ? ?Patient Status: Andrew Wu ? ?History of Present Illness: ?Andrew Wu. is a 81 y.o. male with a medical history significant for sinoatrial node dysfunction s/p pacemaker, DM2, depression, prostate cancer and a recently identified right upper lobe lung mass. He recently had his pacemaker battery changed and it was during this work up that an abnormal finding was seen on chest radiograph. A CT chest was obtained and this was significant for a right upper lobe mass with metastases.  ? ?CT chest with contrast 11/10/21 ?Lungs/Pleura: Centrilobular and paraseptal emphysema. Spiculated ?mass in the right upper lobe measures 3.4 x 4.4 cm and has a long ?border of contact with the visceral pleura. Findings are new from ?04/25/2019. Calcified granulomas. No pleural fluid. Airway is ?unremarkable. ?IMPRESSION: ?1. Spiculated right upper lobe mass with right hilar adenopathy, ?findings consistent with primary bronchogenic carcinoma. New nodule ?in the left upper quadrant, likely arising from the left adrenal ?gland, raising suspicion for stage IV disease. Thoracic surgery ?consultation and PET would be helpful, as clinically indicated. ?2. Liver may be steatotic. ?3. Left adrenal adenoma. ?4. Gastric wall thickening can be seen with gastritis. ?5. Asymmetrically enlarged, heterogeneous and nodular left thyroid. ?Recommend thyroid ultrasound. (Ref: J Am Coll Radiol. 2015 ?Feb;12(2): 143-50).Next number Aortic atherosclerosis (ICD10-I70.0). ?Coronary artery calcification. ?6.  Emphysema (ICD10-J43.9). ? ?Interventional Radiology has been asked to evaluate this patient for an image-guided right upper lobe mass biopsy for further work up. Imaging reviewed and procedure approved by Dr. Pascal Lux.  ? ?Past Medical History:  ?Diagnosis Date  ? Arthritis   ? Cardiac  pacemaker in situ   ? Depressive disorder, not elsewhere classified   ? Presence of permanent cardiac pacemaker   ? Rib fractures 04/25/2019  ? FROM FALL AT HOME   ? Sinoatrial node dysfunction (HCC)   ? Type II or unspecified type diabetes mellitus without mention of complication, not stated as uncontrolled   ? Unspecified essential hypertension   ? Unspecified sleep apnea   ? ? ?Past Surgical History:  ?Procedure Laterality Date  ? APPENDECTOMY    ? HERNIA REPAIR    ? INSERT / REPLACE / REMOVE PACEMAKER  2006 ?   ? pacemaker  02/23/08  ? medtronic Adapta-sinus node dysfunction   ? PPM GENERATOR CHANGEOUT N/A 11/17/2021  ? Procedure: PPM GENERATOR CHANGEOUT;  Surgeon: Isaias Cowman, MD;  Location: [facility];  Service: Cardiovascular;  Laterality: N/A;  ? PROSTATE SURGERY    ? SEED IMPLANT  ? ? ?Allergies: ?Lisinopril, Amoxicillin, Losartan, Metformin and related, and Asa [aspirin] ? ?Medications: ?Prior to Admission medications   ?Medication Sig Start Date End Date Taking? Authorizing Provider  ?atorvastatin (LIPITOR) 20 MG tablet Take 20 mg by mouth at bedtime.    Yes [provider]  ?COMBIGAN 0.2-0.5 % ophthalmic solution Place 1 drop into both eyes 2 (two) times daily. 02/28/19  Yes [provider]  ?insulin degludec (TRESIBA) 100 UNIT/ML FlexTouch Pen Inject 18 Units into the skin at bedtime.    Yes [provider]  ?ketorolac (ACULAR) 0.5 % ophthalmic solution Place 1 drop into the right eye daily. 03/21/21  Yes [provider]  ?polyethylene glycol (MIRALAX / GLYCOLAX) 17 g packet Take 17 g by mouth daily.   Yes [provider]  ?PRESCRIPTION MEDICATION Place 1 drop into both ears once a week.  Oil for dry ears ?If runs out use virgin olive oil   Yes [provider]  ?aspirin EC 81 MG tablet Take 81 mg by mouth daily.    [provider]  ?ezetimibe (ZETIA) 10 MG tablet Take 10 mg by mouth daily. 01/25/21   [provider]   ?Levan Hurst COVID-19 VACCINE 100 MCG/0.5ML injection  02/11/21   [provider]  ?NOVOLOG FLEXPEN 100 UNIT/ML FlexPen 6-12 Units 3 (three) times daily with meals. Sliding Scale ?Take if blood glucose is above 70 ?70-100= 6 units ?101-150=8 units ?151-200= 10 units ?201-250=12 units ?Above 250 = 14 units ?Patient not taking: Reported on 12/15/2021 03/11/21   [provider]  ?  ? ?History reviewed. No pertinent family history. ? ?Social History  ? ?Socioeconomic History  ? Marital status: Married  ?  Spouse name: Not on file  ? Number of children: Not on file  ? Years of education: Not on file  ? Highest education level: Not on file  ?Occupational History  ? Not on file  ?Tobacco Use  ? Smoking status: Former  ?  Types: Cigarettes  ? Smokeless tobacco: Never  ?Vaping Use  ? Vaping Use: Never used  ?Substance and Sexual Activity  ? Alcohol use: No  ? Drug use: No  ? Sexual activity: Not on file  ?Other Topics Concern  ? Not on file  ?Social History Narrative  ? Certified letter sent, not doing follow ups, returned signed by pt. No apts made 10/16/09  ? ?Social Determinants of Health  ? ?Financial Resource Strain: Not on file  ?Food Insecurity: Not on file  ?Transportation Needs: Not on file  ?Physical Activity: Not on file  ?Stress: Not on file  ?Social Connections: Not on file  ? ? ?Review of Systems: A 12 point ROS discussed and pertinent positives are indicated in the HPI above.  All other systems are negative. ? ?Review of Systems  ?Constitutional:  Positive for fatigue. Negative for appetite change.  ?Respiratory:  Negative for cough and shortness of breath.   ?Cardiovascular:  Negative for chest pain and leg swelling.  ?Gastrointestinal:  Negative for abdominal pain, diarrhea, nausea and vomiting.  ?Neurological:  Negative for dizziness and headaches.  ? ?Vital Signs: ?BP (!) 112/58   Pulse 60   Temp 98.2 ?F (36.8 ?C) (Oral)   Resp 18   Ht 5\' 6"  (1.676 m)   Wt 150 lb (68 kg)   SpO2 97%   BMI  24.21 kg/m?  ? ?Physical Exam ?Constitutional:   ?   General: He is not in acute distress. ?HENT:  ?   Mouth/Throat:  ?   Mouth: Mucous membranes are moist.  ?   Pharynx: Oropharynx is clear.  ?Cardiovascular:  ?   Rate and Rhythm: Normal rate and regular rhythm.  ?   Pulses: Normal pulses.  ?   Heart sounds: Normal heart sounds.  ?   Comments: Left upper chest pace maker. ?Pulmonary:  ?   Effort: Pulmonary effort is normal.  ?   Breath sounds: Normal breath sounds.  ?Abdominal:  ?   General: Bowel sounds are normal.  ?   Palpations: Abdomen is soft.  ?Musculoskeletal:  ?   Right lower leg: No edema.  ?   Left lower leg: No edema.  ?Skin: ?   General: Skin is warm and dry.  ?Neurological:  ?   Mental Status: He is alert and oriented to person, place, and time.  ? ? ?Imaging: ?  EP PPM/ICD IMPLANT ? ?Result Date: 11/17/2021 ?Successful dual-chamber pacemaker generator change  ? ?NM PET Image Initial (PI) Skull Base To Thigh (F-18 FDG) ? ?Result Date: 12/02/2021 ?CLINICAL DATA:  Initial treatment strategy for right upper lobe lung mass. EXAM: NUCLEAR MEDICINE PET SKULL BASE TO THIGH TECHNIQUE: 7.7 mCi F-18 FDG was injected intravenously. Full-ring PET imaging was performed from the skull base to thigh after the radiotracer. CT data was obtained and used for attenuation correction and anatomic localization. Fasting blood glucose: 160 mg/dl COMPARISON:  11/10/2021 chest CT. FINDINGS: Mediastinal blood pool activity: SUV max 3.5 Liver activity: SUV max NA NECK: No hypermetabolic lymph nodes in the neck. Incidental CT findings: Thyroid gland is mildly enlarged, asymmetrically enlarged on the left, without hypermetabolic or discrete thyroid nodules. CHEST: Hypermetabolic lobulated solid 7.4 x 5.1 cm peripheral right upper lobe lung mass with max SUV 27.6 (series 10/image 25), with broad attachment to the peripheral pleural without definite bony erosions in the adjacent ribs. Hypermetabolic right suprahilar adenopathy with max  SUV 17.5. No enlarged or hypermetabolic mediastinal, left hilar or axillary lymph nodes. Incidental CT findings: Mild-to-moderate centrilobular and paraseptal emphysema. Two lead left subclavian pacemaker with l

## 2021-12-15 NOTE — Procedures (Signed)
Pre procedural Dx: Hypermetabolic right upper lobe pulmonary mass ?Post procedural Dx: Same ? ?Technically successful CT guided biopsy of indeterminate hypermetabolic right upper lobe pulmonary mass.  ?  ?EBL: None.  ? ?Complications: None immediate.  ? ?Ronny Bacon, MD ?Pager #: [phone] ? ? ? ?

## 2021-12-21 ENCOUNTER — Other Ambulatory Visit: Payer: Self-pay | Admitting: Anatomic Pathology & Clinical Pathology

## 2021-12-21 DIAGNOSIS — J439 Emphysema, unspecified: Secondary | ICD-10-CM | POA: Diagnosis not present

## 2021-12-21 DIAGNOSIS — R0609 Other forms of dyspnea: Secondary | ICD-10-CM | POA: Diagnosis not present

## 2021-12-21 DIAGNOSIS — C3402 Malignant neoplasm of left main bronchus: Secondary | ICD-10-CM | POA: Diagnosis not present

## 2021-12-21 LAB — SURGICAL PATHOLOGY

## 2021-12-23 ENCOUNTER — Inpatient Hospital Stay: Payer: Medicare HMO

## 2021-12-23 ENCOUNTER — Other Ambulatory Visit: Payer: Self-pay | Admitting: *Deleted

## 2021-12-23 ENCOUNTER — Inpatient Hospital Stay (HOSPITAL_BASED_OUTPATIENT_CLINIC_OR_DEPARTMENT_OTHER): Payer: Medicare HMO | Admitting: Hospice and Palliative Medicine

## 2021-12-23 ENCOUNTER — Encounter: Payer: Self-pay | Admitting: *Deleted

## 2021-12-23 ENCOUNTER — Other Ambulatory Visit: Payer: Self-pay

## 2021-12-23 ENCOUNTER — Inpatient Hospital Stay: Payer: Medicare HMO | Attending: Oncology | Admitting: Oncology

## 2021-12-23 VITALS — BP 117/67 | HR 60 | Temp 96.6°F | Resp 18 | Ht 66.0 in | Wt 140.8 lb

## 2021-12-23 DIAGNOSIS — C3411 Malignant neoplasm of upper lobe, right bronchus or lung: Secondary | ICD-10-CM | POA: Insufficient documentation

## 2021-12-23 DIAGNOSIS — C3490 Malignant neoplasm of unspecified part of unspecified bronchus or lung: Secondary | ICD-10-CM

## 2021-12-23 DIAGNOSIS — R300 Dysuria: Secondary | ICD-10-CM

## 2021-12-23 DIAGNOSIS — Z87891 Personal history of nicotine dependence: Secondary | ICD-10-CM | POA: Diagnosis not present

## 2021-12-23 DIAGNOSIS — Z7189 Other specified counseling: Secondary | ICD-10-CM

## 2021-12-23 DIAGNOSIS — R5383 Other fatigue: Secondary | ICD-10-CM | POA: Insufficient documentation

## 2021-12-23 DIAGNOSIS — R634 Abnormal weight loss: Secondary | ICD-10-CM | POA: Insufficient documentation

## 2021-12-23 LAB — URINALYSIS, COMPLETE (UACMP) WITH MICROSCOPIC
Bacteria, UA: NONE SEEN
Bilirubin Urine: NEGATIVE
Glucose, UA: >=500 mg/dL — AB
Ketones, ur: 80 mg/dL — AB
Leukocytes,Ua: NEGATIVE
Nitrite: NEGATIVE
Protein, ur: NEGATIVE mg/dL
Specific Gravity, Urine: 1.031 — ABNORMAL HIGH (ref 1.005–1.030)
pH: 5 (ref 5.0–8.0)

## 2021-12-23 MED ORDER — OXYCODONE HCL 5 MG PO TABS
5.0000 mg | ORAL_TABLET | ORAL | 0 refills | Status: DC | PRN
Start: 2021-12-23 — End: 2022-01-13

## 2021-12-23 MED ORDER — PREDNISONE 10 MG PO TABS: 10.0000 mg | ORAL_TABLET | Freq: Every day | ORAL | 0 refills | Status: AC

## 2021-12-23 NOTE — Progress Notes (Signed)
Stomach pain,phelm with blood and lots of weakness,fatigue,loss of appetite, weight loss, skin changes (dark blotches on his face,swelling on his neck, cough up really thick mucus, along with voice change) Sx started around 3 months ago. Within the past month wife states his health has declined, PET scan has been put off, dx February 1st. Pt states that his heart has been beating hard and faster ever since they changed his pacemaker on March 14th.  ? ?

## 2021-12-23 NOTE — Progress Notes (Signed)
Request for Omniseq testing on recent lung biopsy faxed to pathology to send out.  ?

## 2021-12-23 NOTE — Progress Notes (Signed)
Met with patient and his wife during initial consult with Dr. Janese Banks to discuss recent lung cancer diagnosis and treatment options. All questions answered during visit. Reviewed upcoming appts. Pt's wife would like to postpone head CT until after molecular studies have been reported. Contact info given and instructed to call with any questions or needs. Pt's wife verbalized understanding. Nothing further needed at this time. ?

## 2021-12-23 NOTE — Progress Notes (Signed)
? ?  ?Palliative Medicine ?[place] [facility] ?Telephone:(336) [phone] Fax:(336) [phone] ? ? ?Name: Andrew Wu. ?Date: 12/23/2021 ?MRN: [id number]  ?DOB: 1940-12-21 ? ?Patient Care Team: ?Deland Pretty, MD as PCP - General (Internal Medicine) ?Telford Nab, RN as Sales executive  ? ? ?REASON FOR CONSULTATION: ?Andrew Wu. is a 81 y.o. male with multiple medical problems including diabetes, depression, history of prostate cancer and newly diagnosed stage IV lung cancer.  Patient recently underwent pacemaker battery exchange and was incidentally found to have right upper lobe mass on radiographs with subsequent CT revealed widespread nodal metastatic disease.  Lung biopsy confirmed non-small cell carcinoma favoring adenocarcinoma.  Patient was referred to palliative care to help address goals. ? ?SOCIAL HISTORY:    ? reports that he has quit smoking. His smoking use included cigarettes. He has never used smokeless tobacco. He reports that he does not drink alcohol and does not use drugs. ? ?Patient is married and lives at home with his wife ? ?ADVANCE DIRECTIVES:  ?None on file ? ?CODE STATUS:  ? ?PAST MEDICAL HISTORY: ?Past Medical History:  ?Diagnosis Date  ? Arthritis   ? Cardiac pacemaker in situ   ? Depressive disorder, not elsewhere classified   ? Presence of permanent cardiac pacemaker   ? Rib fractures 04/25/2019  ? FROM FALL AT HOME   ? Sinoatrial node dysfunction (HCC)   ? Type II or unspecified type diabetes mellitus without mention of complication, not stated as uncontrolled   ? Unspecified essential hypertension   ? Unspecified sleep apnea   ? ? ?PAST SURGICAL HISTORY:  ?Past Surgical History:  ?Procedure Laterality Date  ? APPENDECTOMY    ? HERNIA REPAIR    ? INSERT / REPLACE / REMOVE PACEMAKER  2006 ?   ? pacemaker  02/23/08  ? medtronic Adapta-sinus node dysfunction   ? PPM GENERATOR CHANGEOUT N/A 11/17/2021  ? Procedure: PPM GENERATOR CHANGEOUT;  Surgeon:  Isaias Cowman, MD;  Location: [place] [facility];  Service: Cardiovascular;  Laterality: N/A;  ? PROSTATE SURGERY    ? SEED IMPLANT  ? ? ?HEMATOLOGY/ONCOLOGY HISTORY:  ?Oncology History  ? No history exists.  ? ? ?ALLERGIES:  is allergic to lisinopril, amoxicillin, losartan, metformin and related, and asa [aspirin]. ? ?MEDICATIONS:  ?Current Outpatient Medications  ?Medication Sig Dispense Refill  ? acetaminophen (TYLENOL) 325 MG tablet     ? aspirin EC 81 MG tablet Take 81 mg by mouth daily.    ? atorvastatin (LIPITOR) 20 MG tablet Take 20 mg by mouth at bedtime.     ? buPROPion (WELLBUTRIN XL) 150 MG 24 hr tablet TAKE 1 TABLET BY MOUTH EVERY DAY IN THE MORNING FOR 30 DAYS    ? clarithromycin (BIAXIN) 500 MG tablet Take 500 mg by mouth 2 (two) times daily. (Patient not taking: Reported on 12/23/2021)    ? COMBIGAN 0.2-0.5 % ophthalmic solution Place 1 drop into both eyes 2 (two) times daily.    ? ezetimibe (ZETIA) 10 MG tablet Take 10 mg by mouth daily.    ? insulin degludec (TRESIBA) 100 UNIT/ML FlexTouch Pen Inject 18 Units into the skin at bedtime.     ? ketorolac (ACULAR) 0.5 % ophthalmic solution Place 1 drop into the right eye daily.    ? MODERNA COVID-19 VACCINE 100 MCG/0.5ML injection  (Patient not taking: Reported on 12/23/2021)    ? NOVOLOG FLEXPEN 100 UNIT/ML FlexPen 6-12 Units 3 (three) times daily with meals. Sliding  Scale ?Take if blood glucose is above 70 ?70-100= 6 units ?101-150=8 units ?151-200= 10 units ?201-250=12 units ?Above 250 = 14 units (Patient not taking: Reported on 12/15/2021)    ? oxyCODONE (OXY IR/ROXICODONE) 5 MG immediate release tablet Take 1 tablet (5 mg total) by mouth every 4 (four) hours as needed for severe pain. 60 tablet 0  ? polyethylene glycol (MIRALAX / GLYCOLAX) 17 g packet Take 17 g by mouth daily.    ? prednisoLONE acetate (PRED MILD) 0.12 % ophthalmic suspension 1 drop 4 (four) times daily.    ? PRESCRIPTION MEDICATION Place 1 drop into both ears once a week.  Oil for dry ears ?If runs out use virgin olive oil    ? ?No current facility-administered medications for this visit.  ? ? ?VITAL SIGNS: ?There were no vitals taken for this visit. ?There were no vitals filed for this visit.  ?Estimated body mass index is 22.73 kg/m? as calculated from the following: ?  Height as of an earlier encounter on 12/23/21: 5\' 6"  (1.676 m). ?  Weight as of an earlier encounter on 12/23/21: 140 lb 12.8 oz (63.9 kg). ? ?LABS: ?CBC: ?   ?Component Value Date/Time  ? WBC 15.1 (H) 12/15/2021 0835  ? HGB 10.6 (L) 12/15/2021 0835  ? HCT 33.5 (L) 12/15/2021 0835  ? PLT 246 12/15/2021 0835  ? MCV 83.1 12/15/2021 0835  ? NEUTROABS 8.6 (H) 04/25/2019 1004  ? LYMPHSABS 1.4 04/25/2019 1004  ? MONOABS 0.8 04/25/2019 1004  ? EOSABS 0.1 04/25/2019 1004  ? BASOSABS 0.0 04/25/2019 1004  ? ?Comprehensive Metabolic Panel: ?   ?Component Value Date/Time  ? NA 139 04/26/2019 0306  ? K 4.2 04/26/2019 0306  ? CL 106 04/26/2019 0306  ? CO2 23 04/26/2019 0306  ? BUN 20 04/26/2019 0306  ? CREATININE 1.20 11/10/2021 0847  ? GLUCOSE 213 (H) 04/26/2019 0306  ? CALCIUM 9.6 04/26/2019 0306  ? AST 25 04/25/2019 1004  ? ALT 29 04/25/2019 1004  ? ALKPHOS 68 04/25/2019 1004  ? BILITOT 1.3 (H) 04/25/2019 1004  ? PROT 6.4 (L) 04/25/2019 1004  ? ALBUMIN 3.9 04/25/2019 1004  ? ? ?RADIOGRAPHIC STUDIES: ?NM PET Image Initial (PI) Skull Base To Thigh (F-18 FDG) ? ?Result Date: 12/02/2021 ?CLINICAL DATA:  Initial treatment strategy for right upper lobe lung mass. EXAM: NUCLEAR MEDICINE PET SKULL BASE TO THIGH TECHNIQUE: 7.7 mCi F-18 FDG was injected intravenously. Full-ring PET imaging was performed from the skull base to thigh after the radiotracer. CT data was obtained and used for attenuation correction and anatomic localization. Fasting blood glucose: 160 mg/dl COMPARISON:  11/10/2021 chest CT. FINDINGS: Mediastinal blood pool activity: SUV max 3.5 Liver activity: SUV max NA NECK: No hypermetabolic lymph nodes in the neck. Incidental  CT findings: Thyroid gland is mildly enlarged, asymmetrically enlarged on the left, without hypermetabolic or discrete thyroid nodules. CHEST: Hypermetabolic lobulated solid 7.4 x 5.1 cm peripheral right upper lobe lung mass with max SUV 27.6 (series 10/image 25), with broad attachment to the peripheral pleural without definite bony erosions in the adjacent ribs. Hypermetabolic right suprahilar adenopathy with max SUV 17.5. No enlarged or hypermetabolic mediastinal, left hilar or axillary lymph nodes. Incidental CT findings: Mild-to-moderate centrilobular and paraseptal emphysema. Two lead left subclavian pacemaker with lead tips in the right atrium and right ventricular apex. Coronary atherosclerosis. Atherosclerotic nonaneurysmal thoracic aorta. ABDOMEN/PELVIS: Hypermetabolic 2.4 cm inferior left adrenal nodule with density 33 HU with max SUV 25.5 (series 4/image 120). Several intensely hypermetabolic  enlarged lymph nodes throughout the mesentery, including a 1.6 cm short axis diameter right mesenteric root node between the SMV and IVC with max SUV 24.5 (series 4/image 131) and a left mesenteric root 1.9 cm node with max SUV 22.5 (series 4/image 127). Additional hypermetabolic foci scattered throughout the left mesentery are poorly defined on the noncontrast CT images, favor additional hypermetabolic left mesenteric nodes. No abnormal hypermetabolic activity within the liver, pancreas, right adrenal gland, or spleen. Incidental CT findings: Atherosclerotic abdominal aorta with mildly dilated 2.6 cm infrarenal abdominal aorta. Brachytherapy seeds throughout the nonenlarged prostate. SKELETON: No focal hypermetabolic activity to suggest skeletal metastasis. Incidental CT findings: none IMPRESSION: 1. Hypermetabolic lobulated solid 7.4 cm peripheral right upper lobe lung mass, compatible with primary bronchogenic malignancy, with broad attachment to the peripheral pleura, without definite bony erosions in the  adjacent ribs. 2. Hypermetabolic right superior hilar nodal metastases. 3. Hypermetabolic left adrenal metastasis. 4. Hypermetabolic widespread mesenteric nodal metastases, most prominent at the root of the mese

## 2021-12-24 ENCOUNTER — Other Ambulatory Visit: Payer: Medicare HMO

## 2021-12-24 NOTE — Progress Notes (Signed)
Tumor Board Documentation ? ?Andrew Wu. was presented by Dr Janese Banks at our Tumor Board on 12/24/2021, which included representatives from medical oncology, pathology, radiology, surgical, pharmacy, pulmonology, genetics, radiation oncology, navigation, research, palliative care, internal medicine. ? ?Andrew Wu currently presents as a new patient, for [place], for new positive pathology with history of the following treatments: surgical intervention(s), active survellience. ? ?Additionally, we reviewed previous medical and familial history, history of present illness, and recent lab results along with all available histopathologic and imaging studies. The tumor board considered available treatment options and made the following recommendations: ?Additional screening (CT Head) ?Comfort Measures, Probale Hospice referral after final Path results ? ?The following procedures/referrals were also placed: No orders of the defined types were placed in this encounter. ? ? ?Clinical Trial Status:    ? ?Staging used: Clinical Stage ?AJCC Staging: ?T: 4 ?N: 1 ?M: 1 ?Group: Stage IV Adenocarcinoma ofRUL Lung ? ? ?National site-specific guidelines   were discussed with respect to the case. ? ?Tumor board is a meeting of clinicians from various specialty areas who evaluate and discuss patients for whom a multidisciplinary approach is being considered. Final determinations in the plan of care are those of the provider(s). The responsibility for follow up of recommendations given during tumor board is that of the provider.  ? ?Today?s extended care, comprehensive team conference, Andrew Wu was not present for the discussion and was not examined.  ? ?Multidisciplinary Tumor Board is a multidisciplinary case peer review process.  Decisions discussed in the Multidisciplinary Tumor Board reflect the opinions of the specialists present at the conference without having examined the patient.  Ultimately, treatment and diagnostic decisions rest with the  primary provider(s) and the patient. ? ?

## 2021-12-25 ENCOUNTER — Telehealth: Payer: Self-pay | Admitting: Student

## 2021-12-25 LAB — URINE CULTURE

## 2021-12-25 NOTE — Telephone Encounter (Signed)
Spoke with patient's wife Iris regarding the Palliative referral/services and all questions were answered and she was in agreement with beginning services.  I have scheduled an In-home Consult for 12/31/21 @ 11 AM ?

## 2021-12-26 ENCOUNTER — Encounter: Payer: Self-pay | Admitting: Oncology

## 2021-12-26 DIAGNOSIS — Z7189 Other specified counseling: Secondary | ICD-10-CM | POA: Insufficient documentation

## 2021-12-26 DIAGNOSIS — C3411 Malignant neoplasm of upper lobe, right bronchus or lung: Secondary | ICD-10-CM | POA: Insufficient documentation

## 2021-12-26 NOTE — Progress Notes (Signed)
? ?Hematology/Oncology Consult note ?[place] ?Telephone:(336) [phone] [phone] ? ?Patient Care Team: ?Deland Pretty, MD as PCP - General (Internal Medicine) ?Telford Nab, RN as Sales executive  ? ?Name of the patient: Andrew Wu  ?[id number]  ?25-Aug-1941  ? ? ?Reason for referral-new diagnosis of lung cancer ?  ?Referring physician-Dr. Deland Pretty ? ? ? ?Date of visit: 12/26/21 ? ? ?History of presenting illness- Patient is a 81 year old African-American male who had a pacemaker implantation and as a part of that underwent a chest radiograph which showed concern for lung mass.  This was followed by a CT chest with contrast which showed a spiculated right upper lobe lung mass with right hilar adenopathy concerning for bronchogenic carcinoma and concern for metastases to the left adrenal gland.  PET CT scan showed hypermetabolic solid 7.4 x 5.1 cm right upper lobe lung mass with broad attachment to the peripheral pleural surface.  Right suprahilar adenopathy.  No evidence of hilar or mediastinal or left hilar or axillary adenopathy.  Hypermetabolic mesenteric lymph nodes.  Hypermetabolic 2.4 cm left adrenal nodule.  Patient had a CT-guided lung biopsy which was consistent with non-small cell lung cancer favoring adenocarcinoma.  Tumor cells showed immunohistochemistry positive for TTF-1 and negative for CK7 CD56 chromogranin and Napsin A.  PSA and PSAP stains were also negative.  He does have a prior history of prostate cancer. ? ?Patient is here with his wife.  He does have some ongoing cognitive impairment and unable to have a complete understanding of his underlying condition.  He has been feeling fatigued and losing weight as well.  He needs assistance with his ADLs.  He reports generalized pain ? ?ECOG PS- 2 ? ?Pain scale- 4 ? ? ?Review of systems- Review of Systems  ?Constitutional:  Positive for malaise/fatigue and weight loss. Negative for chills and fever.  ?HENT:   Negative for congestion, ear discharge and nosebleeds.   ?Eyes:  Negative for blurred vision.  ?Respiratory:  Negative for cough, hemoptysis, sputum production, shortness of breath and wheezing.   ?Cardiovascular:  Negative for chest pain, palpitations, orthopnea and claudication.  ?Gastrointestinal:  Negative for abdominal pain, blood in stool, constipation, diarrhea, heartburn, melena, nausea and vomiting.  ?Genitourinary:  Negative for dysuria, flank pain, frequency, hematuria and urgency.  ?Musculoskeletal:  Negative for back pain, joint pain and myalgias.  ?Skin:  Negative for rash.  ?Neurological:  Negative for dizziness, tingling, focal weakness, seizures, weakness and headaches.  ?Endo/Heme/Allergies:  Does not bruise/bleed easily.  ?Psychiatric/Behavioral:  Negative for depression and suicidal ideas. The patient does not have insomnia.   ? ?Allergies  ?Allergen Reactions  ? Lisinopril Swelling  ?  Facial swelling ? ?  ? Amoxicillin Other (See Comments)  ?  GI Upset  ? Losartan Swelling  ? Metformin And Related Other (See Comments)  ?  GI upset  ? Asa [Aspirin] Other (See Comments)  ?  Stomach hurts - is able to take enteric coated aspirin  ? ? ?Patient Active Problem List  ? Diagnosis Date Noted  ? Hyperlipidemia 04/15/2021  ? Prostate cancer ([place]) 04/15/2021  ? Central sleep apnea due to medical condition 03/31/2020  ? Intolerance of continuous positive airway pressure (CPAP) ventilation 03/04/2020  ? Dementia with behavioral disturbance 03/04/2020  ? Rib fractures   ? Chest trauma 08/06/2018  ? Risk for falls 12/03/2015  ? Sinusitis, chronic 11/14/2013  ? Cough 12/29/2011  ? Deviated nasal septum 12/29/2011  ? Diabetes mellitus type 2, uncomplicated (  [place]) 12/29/2011  ? Dysphagia 12/29/2011  ? Postnasal drip 12/29/2011  ? Diabetes mellitus ([place]) 03/11/2009  ? DEPRESSION 03/11/2009  ? HYPERTENSION, UNSPECIFIED 03/11/2009  ? SINOATRIAL NODE DYSFUNCTION 03/11/2009  ? ARTHRITIS 03/11/2009  ? SLEEP APNEA  03/11/2009  ? PACEMAKER, PERMANENT 03/11/2009  ? ? ? ?Past Medical History:  ?Diagnosis Date  ? Arthritis   ? Cardiac pacemaker in situ   ? Depressive disorder, not elsewhere classified   ? Presence of permanent cardiac pacemaker   ? Rib fractures 04/25/2019  ? FROM FALL AT HOME   ? Sinoatrial node dysfunction (HCC)   ? Type II or unspecified type diabetes mellitus without mention of complication, not stated as uncontrolled   ? Unspecified essential hypertension   ? Unspecified sleep apnea   ? ? ? ?Past Surgical History:  ?Procedure Laterality Date  ? APPENDECTOMY    ? HERNIA REPAIR    ? INSERT / REPLACE / REMOVE PACEMAKER  2006 ?   ? pacemaker  02/23/08  ? medtronic Adapta-sinus node dysfunction   ? PPM GENERATOR CHANGEOUT N/A 11/17/2021  ? Procedure: PPM GENERATOR CHANGEOUT;  Surgeon: Isaias Cowman, MD;  Location: [place] [facility];  Service: Cardiovascular;  Laterality: N/A;  ? PROSTATE SURGERY    ? SEED IMPLANT  ? ? ?Social History  ? ?Socioeconomic History  ? Marital status: Married  ?  Spouse name: Not on file  ? Number of children: Not on file  ? Years of education: Not on file  ? Highest education level: Not on file  ?Occupational History  ? Not on file  ?Tobacco Use  ? Smoking status: Former  ?  Types: Cigarettes  ? Smokeless tobacco: Never  ?Vaping Use  ? Vaping Use: Never used  ?Substance and Sexual Activity  ? Alcohol use: No  ? Drug use: No  ? Sexual activity: Not on file  ?Other Topics Concern  ? Not on file  ?Social History Narrative  ? Certified letter sent, not doing follow ups, returned signed by pt. No apts made 10/16/09  ? ?Social Determinants of Health  ? ?Financial Resource Strain: Not on file  ?Food Insecurity: Not on file  ?Transportation Needs: Not on file  ?Physical Activity: Not on file  ?Stress: Not on file  ?Social Connections: Not on file  ?Intimate Partner Violence: Not on file  ? ?  ?History reviewed. No pertinent family history. ? ? ?Current Outpatient Medications:  ?   acetaminophen (TYLENOL) 325 MG tablet, , Disp: , Rfl:  ?  aspirin EC 81 MG tablet, Take 81 mg by mouth daily., Disp: , Rfl:  ?  atorvastatin (LIPITOR) 20 MG tablet, Take 20 mg by mouth at bedtime. , Disp: , Rfl:  ?  buPROPion (WELLBUTRIN XL) 150 MG 24 hr tablet, TAKE 1 TABLET BY MOUTH EVERY DAY IN THE MORNING FOR 30 DAYS, Disp: , Rfl:  ?  COMBIGAN 0.2-0.5 % ophthalmic solution, Place 1 drop into both eyes 2 (two) times daily., Disp: , Rfl:  ?  ezetimibe (ZETIA) 10 MG tablet, Take 10 mg by mouth daily., Disp: , Rfl:  ?  insulin degludec (TRESIBA) 100 UNIT/ML FlexTouch Pen, Inject 18 Units into the skin at bedtime. , Disp: , Rfl:  ?  ketorolac (ACULAR) 0.5 % ophthalmic solution, Place 1 drop into the right eye daily., Disp: , Rfl:  ?  polyethylene glycol (MIRALAX / GLYCOLAX) 17 g packet, Take 17 g by mouth daily., Disp: , Rfl:  ?  prednisoLONE acetate (PRED MILD)  0.12 % ophthalmic suspension, 1 drop 4 (four) times daily., Disp: , Rfl:  ?  predniSONE (DELTASONE) 10 MG tablet, Take 1 tablet (10 mg total) by mouth daily with breakfast for 10 days., Disp: 10 tablet, Rfl: 0 ?  PRESCRIPTION MEDICATION, Place 1 drop into both ears once a week. Oil for dry ears If runs out use virgin olive oil, Disp: , Rfl:  ?  clarithromycin (BIAXIN) 500 MG tablet, Take 500 mg by mouth 2 (two) times daily. (Patient not taking: Reported on 12/23/2021), Disp: , Rfl:  ?  MODERNA COVID-19 VACCINE 100 MCG/0.5ML injection, , Disp: , Rfl:  ?  NOVOLOG FLEXPEN 100 UNIT/ML FlexPen, 6-12 Units 3 (three) times daily with meals. Sliding Scale Take if blood glucose is above 70 70-100= 6 units 101-150=8 units 151-200= 10 units 201-250=12 units Above 250 = 14 units (Patient not taking: Reported on 12/15/2021), Disp: , Rfl:  ?  oxyCODONE (OXY IR/ROXICODONE) 5 MG immediate release tablet, Take 1 tablet (5 mg total) by mouth every 4 (four) hours as needed for severe pain., Disp: 60 tablet, Rfl: 0 ? ? ?Physical exam:  ?Vitals:  ? 12/23/21 1358  ?BP: 117/67   ?Pulse: 60  ?Resp: 18  ?Temp: (!) 96.6 ?F (35.9 ?C)  ?SpO2: 100%  ?Weight: 140 lb 12.8 oz (63.9 kg)  ?Height: 5\' 6"  (1.676 m)  ? ?Physical Exam ?Constitutional:   ?   Comments: Appears  frail and sitting in a wheelch

## 2021-12-30 ENCOUNTER — Encounter: Payer: Self-pay | Admitting: *Deleted

## 2021-12-30 ENCOUNTER — Inpatient Hospital Stay (HOSPITAL_BASED_OUTPATIENT_CLINIC_OR_DEPARTMENT_OTHER): Payer: Medicare HMO | Admitting: Hospice and Palliative Medicine

## 2021-12-30 DIAGNOSIS — C3490 Malignant neoplasm of unspecified part of unspecified bronchus or lung: Secondary | ICD-10-CM | POA: Diagnosis not present

## 2021-12-30 NOTE — Progress Notes (Signed)
Virtual Visit via Telephone Note ? ?I connected with Andrew Wu. on 12/30/21 at  1:00 PM EDT by telephone and verified that I am speaking with the correct person using two identifiers. ? ?Location: ?Patient: Home ?Provider: Clinic ?  ?I discussed the limitations, risks, security and privacy concerns of performing an evaluation and management service by telephone and the availability of in person appointments. I also discussed with the patient that there may be a patient responsible charge related to this service. The patient expressed understanding and agreed to proceed. ? ? ?History of Present Illness: ?Andrew Wu. is a 81 y.o. male with multiple medical problems including diabetes, depression, history of prostate cancer and newly diagnosed stage IV lung cancer.  Patient recently underwent pacemaker battery exchange and was incidentally found to have right upper lobe mass on radiographs with subsequent CT revealed widespread nodal metastatic disease.  Lung biopsy confirmed non-small cell carcinoma favoring adenocarcinoma.  Patient was referred to palliative care to help address goals. ?  ?Observations/Objective: ?I called to speak with patient by phone but spoke with his wife instead.  She reports that he is rapidly declining and has had periods of confusion and extremely poor oral intake.  He is only eating bites and sips.  He has had progressive weakness and is now unable to get out of bed without assistance.  She asked about sending him to the hospital for IV fluids, which we discussed in detail.  I also offered clinic evaluation.  However, given the rapid nature of his decline, I suspect that he is likely nearing end-of-life.  Patient has previously declined option for systemic chemotherapy.  NGS testing was pending for consideration of immunotherapy.  However, it seems unlikely that patient will survive long enough to receive therapeutic benefit from immunotherapy. ? ?Ultimately, wife stated that she  would prefer to keep patient home and focus on his comfort.  We discussed the role of hospice and providing supportive/comfort care.  Patient's wife verbalized agreement with proceeding with hospice. ? ?Assessment and Plan: ?Stage IV lung cancer -patient is rapidly declining and appears to be nearing end-of-life.  We will proceed with hospice care at home. ? ?Case and plan discussed with Dr. Janese Banks ? ?Follow Up Instructions: ?As needed ?  ?I discussed the assessment and treatment plan with the patient. The patient was provided an opportunity to ask questions and all were answered. The patient agreed with the plan and demonstrated an understanding of the instructions. ?  ?The patient was advised to call back or seek an in-person evaluation if the symptoms worsen or if the condition fails to improve as anticipated. ? ?I provided 10 minutes of non-face-to-face time during this encounter. ? ? ?Irean Hong, NP ? ? ?

## 2021-12-30 NOTE — Progress Notes (Signed)
Per Dr. Janese Banks and Billey Chang, NP, pt is transitioning to hospice services at this time. Order for [employer] cancelled. Nothing further needed.  ?

## 2021-12-31 ENCOUNTER — Other Ambulatory Visit: Payer: Self-pay | Admitting: Student

## 2021-12-31 ENCOUNTER — Other Ambulatory Visit: Payer: Medicare HMO

## 2022-01-07 ENCOUNTER — Telehealth: Payer: Self-pay | Admitting: *Deleted

## 2022-01-07 NOTE — Telephone Encounter (Signed)
Patient wife Andrew Wu called asking to speak with Dr Janese Banks regarding the test results that were pending to decide if patient would be a candidate for Immunotherapy or not. She asked if he would be able to take it and stay on hospice services and I explained to her that he would have to come off hospice to take treatment. She would still like to know options and the results Please return her call. I told her it would most likely be tomorrow before she would get an answer as Dr Janese Banks is out of the office today and she was fine with that ?

## 2022-01-08 NOTE — Telephone Encounter (Signed)
Can one of you call her? Since I was told he was "rapidly declining"- NGS testign was cancelled

## 2022-01-08 NOTE — Telephone Encounter (Signed)
I spoke with patient's wife.  She reports that her son had questions about testing.  However, she says that patient continues to decline.  He is unable to get out of bed at this point and is only eating bites and sips.  She recognizes that he is likely rapidly nearing end-of-life.  We discussed the timeframe it would take for treatments like immunotherapy to be effective, as well as the fact that patient would have to physically be able to transport to the [facility], which he cannot do at this point.  She says that she is comfortable with her decision to continue comfort/hospice care at home.  I offered to speak to her son if that would be helpful and encouraged her to call back if she had any additional questions. ?

## 2022-01-11 ENCOUNTER — Telehealth: Payer: Self-pay | Admitting: *Deleted

## 2022-01-11 ENCOUNTER — Encounter: Payer: Self-pay | Admitting: Licensed Clinical Social Worker

## 2022-01-11 NOTE — Telephone Encounter (Signed)
I spoke with patient's wife regarding clinical utility of insulin management as patient is nearing end-of-life.  Patient is no longer eating but is drinking 2-3 Glucerna's daily.  Wife stopped insulin management and CBGs have gone as high as 500.  She would like to restart insulin, which I feel is okay if consistent with family goals/hospice management.  I will also reach out to hospice nurse to discuss. ?

## 2022-01-11 NOTE — Telephone Encounter (Signed)
Iris called asking for return call from Sharion Dove, NP to discuss whether she should continue checking patient BS and giving him insulin. She stated that the hospice nurse was unsure of this ?

## 2022-01-11 NOTE — Progress Notes (Signed)
[facility] CSW Progress Note ? ?Clinical Social Worker contacted caregiver by phone to follow-up on previous phone call. [name] stated she is doing well, and is the patient is active with hospice and had everything she needed.  [name] stated she would contact CSW if she has any questions or concerns. ? ? ? ?Elizabeth Haff , LCSW ?

## 2022-01-13 ENCOUNTER — Other Ambulatory Visit: Payer: Self-pay | Admitting: Hospice and Palliative Medicine

## 2022-01-13 MED ORDER — MORPHINE SULFATE (CONCENTRATE) 10 MG /0.5 ML PO SOLN: 5.0000 mg | ORAL | 0 refills | Status: AC | PRN

## 2022-01-13 NOTE — Progress Notes (Signed)
Hospice requested morphine elixir for pain/dyspnea. Patient is no longer able to swallow the oxycodone. Rx sent to pharmacy.  ?

## 2022-01-18 ENCOUNTER — Telehealth: Payer: Medicare HMO | Admitting: Oncology

## 2022-02-01 DEATH — deceased

## 2022-09-13 IMAGING — DX DG CHEST 1V PORT
1 series · 1 of 1 positions shown · non-contrast
Comparison: 10/22/2021

CLINICAL DATA: Post CT-guided lung biopsy

EXAM:
PORTABLE CHEST 1 VIEW

[chest ap]
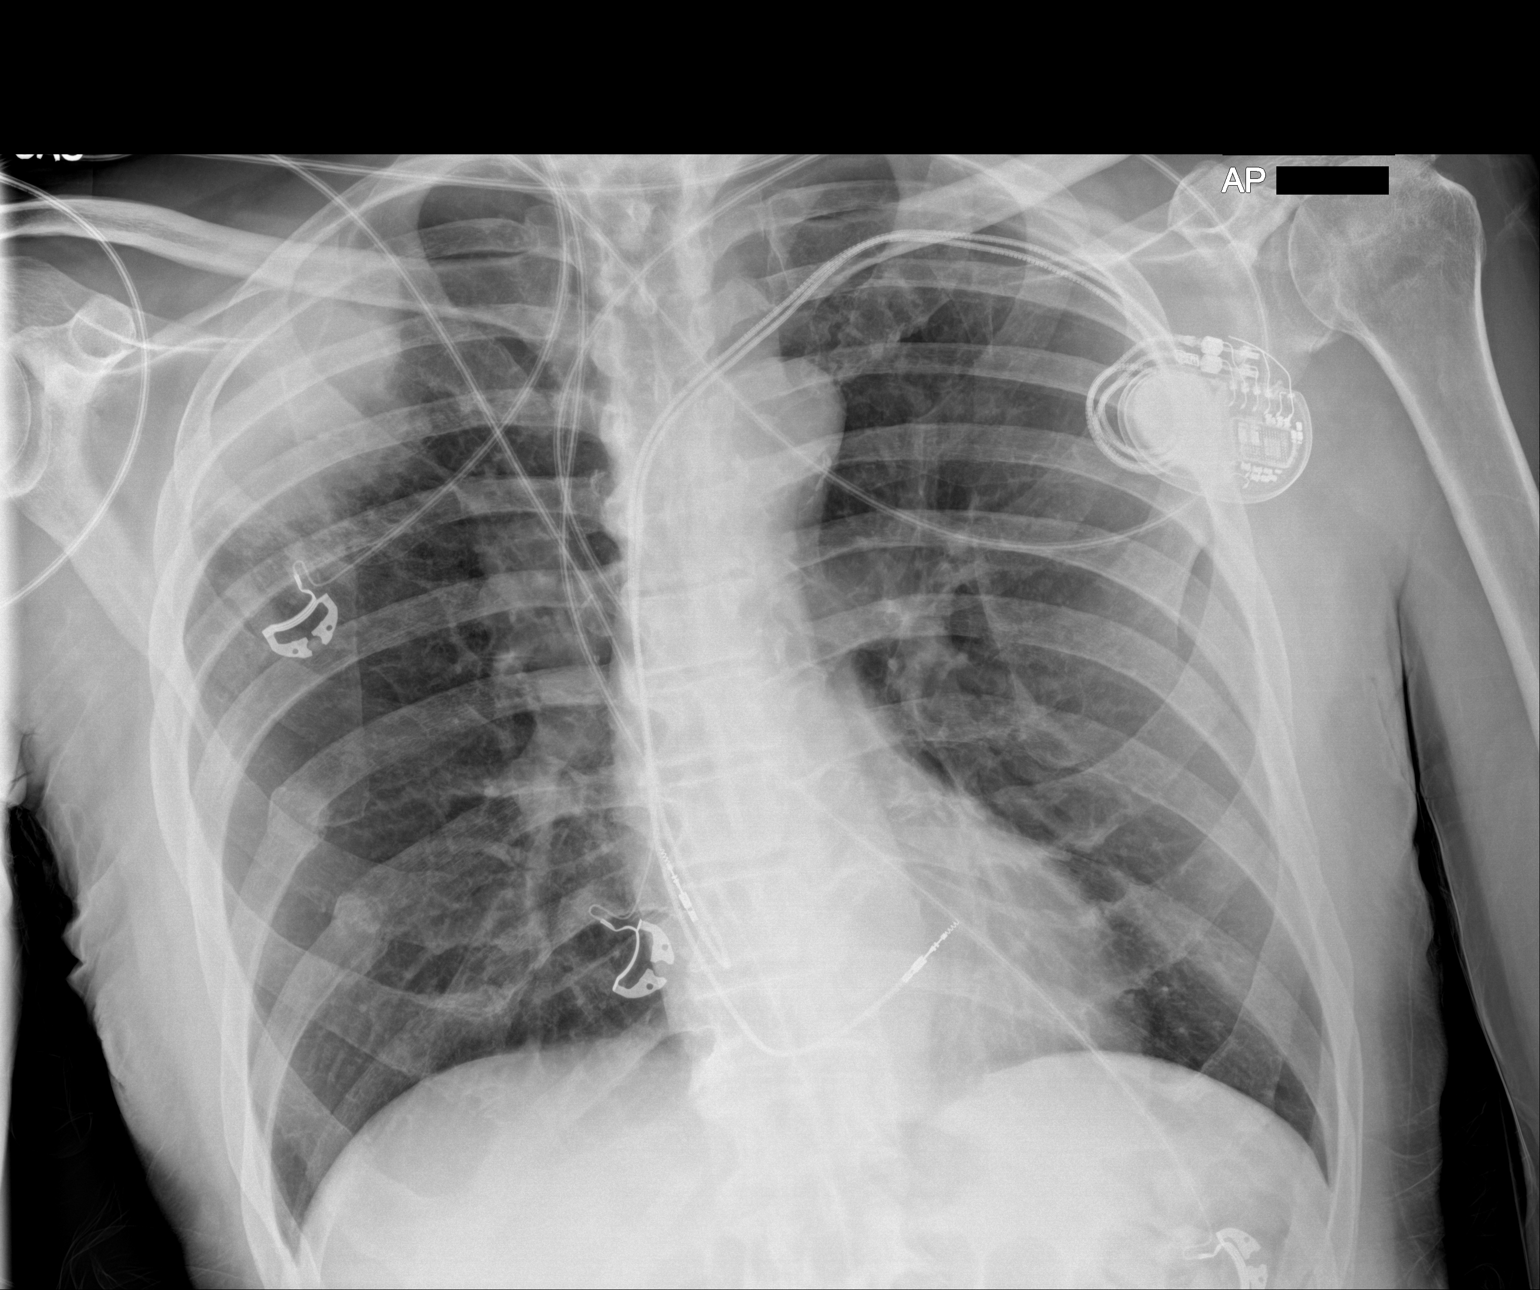

[1 of 1 positions shown; findings below may reference images not displayed]

FINDINGS: Increased density in the right upper lobe presumably reflects
combination of biopsy change and underlying mass. No pneumothorax.
No pleural effusion. Normal heart size. Chronic right rib fractures.
IMPRESSION: Increased density right upper lobe probably reflects combination of
biopsy change and underlying mass. No pneumothorax.

## 2022-09-13 IMAGING — CT CT BIOPY CORE LUNG/MEDIASTINUM
2 of 4 series · 6 of 14 positions shown, 7 images · non-contrast
Comparison: PET-CT-12/02/2021;

INDICATION: History of prostate cancer, now with indeterminate hypermetabolic
right upper lobe pulmonary mass. Please perform CT-guided biopsy for
tissue diagnostic purposes.

EXAM:
CT-GUIDED RIGHT UPPER LOBE PULMONARY MASS BIOPSY.

[Series 2: i-spiral 5.0 br38 · axial · 0.74mm/px · z∈[+1412,+1477]mm · 3 of 27 slices shown (1 of 2)]
[im 7/27  bone]
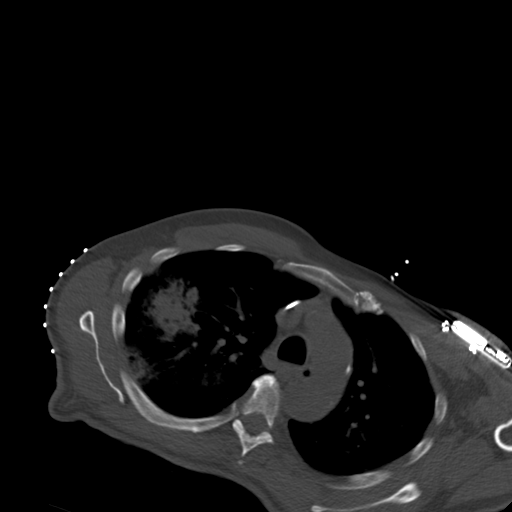
[im 14/27  bone]
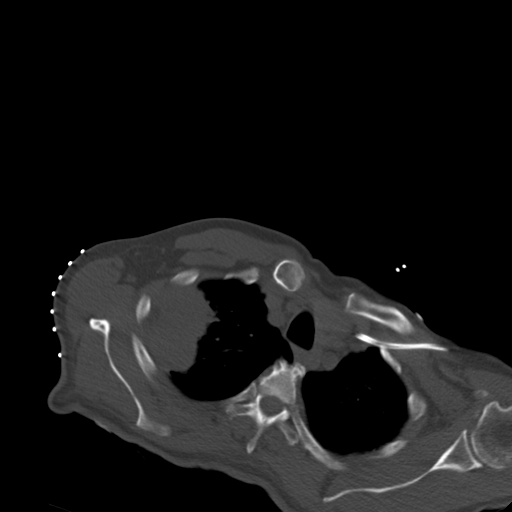
[im 20/27  bone]
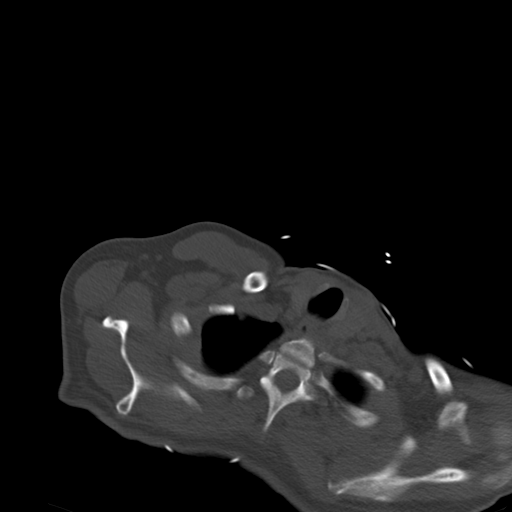

[Series 8: i-spiral 5.0 br38 · axial · 0.74mm/px · z∈[+1403,+1478]mm · 3 of 31 slices shown, 4 images (2 of 2)]
[im 8/31  soft-tissue]
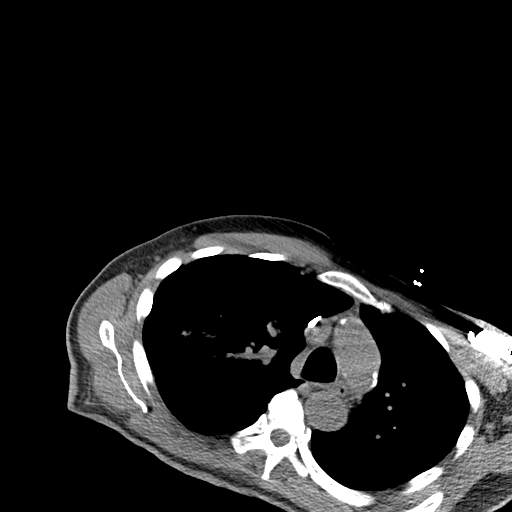
[im 8/31  bone]
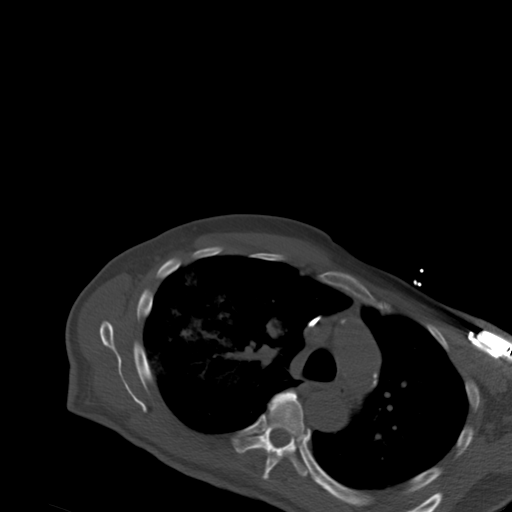
[im 16/31  bone]
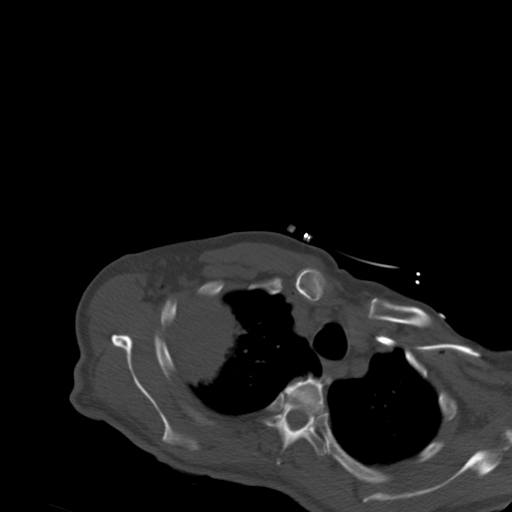
[im 23/31  bone]
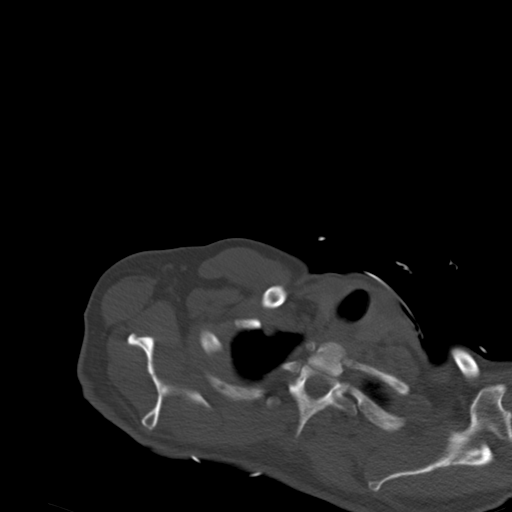

[6 of 14 positions shown; findings below may reference images not displayed]

chest CT-11/10/2021

MEDICATIONS:
None.

ANESTHESIA/SEDATION:
A total of Versed 1 mg and Fentanyl 50 mcg was administered
intravenously.

Moderate Sedation Time: 14 minutes. The patient's level of
consciousness and vital signs were monitored continuously by
radiology nursing throughout the procedure under my direct
supervision.

CONTRAST:  None

COMPLICATIONS:
None immediate.

PROCEDURE:
RADIATION DOSE REDUCTION: This exam was performed according to the
departmental dose-optimization program which includes automated
exposure control, adjustment of the mA and/or kV according to
patient size and/or use of iterative reconstruction technique.

Informed consent was obtained from the patient following an
explanation of the procedure, risks, benefits and alternatives. The
patient understands,agrees and consents for the procedure. All
questions were addressed. A time out was performed prior to the
initiation of the procedure.

The patient was positioned supine, slightly LPO on the CT table and
a limited chest CT was performed for procedural planning
demonstrating unchanged size and appearance of the macrolobulated at
least 6.7 x 4.9 cm mass within the peripheral aspect the right upper
lobe (image 15, series 2). The operative site was prepped and draped
in the usual sterile fashion. Under sterile conditions and local
anesthesia, a 17 gauge coaxial needle was advanced into the
peripheral aspect of the nodule. Positioning was confirmed with
intermittent CT fluoroscopy and followed by the acquisition of 5
core needle biopsy samples with an 18 gauge core needle biopsy
device. The coaxial needle was removed following deployment of a
Biosentry plug and superficial hemostasis was achieved with manual
compression.

Limited post procedural chest CT was negative for pneumothorax or
additional complication. A dressing was applied. The patient
tolerated the procedure well without immediate postprocedural
complication. The patient was escorted to have an upright chest
radiograph.
IMPRESSION: Technically successful CT guided core needle core biopsy of
macrolobulated right upper lobe pulmonary mass.
